# Patient Record
Sex: Male | Born: 1990 | Race: Black or African American | Hispanic: No | Marital: Single | Smoking: Current every day smoker
Health system: Southern US, Community
[De-identification: ages and names within clinical notes are randomized; demographics above are authoritative.]

## PROBLEM LIST (undated history)

## (undated) DIAGNOSIS — D573 Sickle-cell trait: Secondary | ICD-10-CM

## (undated) DIAGNOSIS — I10 Essential (primary) hypertension: Secondary | ICD-10-CM

## (undated) DIAGNOSIS — F419 Anxiety disorder, unspecified: Secondary | ICD-10-CM

## (undated) DIAGNOSIS — R002 Palpitations: Secondary | ICD-10-CM

## (undated) HISTORY — DX: Sickle-cell trait: D57.3

## (undated) HISTORY — DX: Palpitations: R00.2

---
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
## 2008-07-03 ENCOUNTER — Emergency Department (HOSPITAL_COMMUNITY): Admission: EM | Admit: 2008-07-03 | Discharge: 2008-07-03 | Payer: Self-pay | Admitting: Emergency Medicine

## 2008-08-13 ENCOUNTER — Emergency Department (HOSPITAL_COMMUNITY): Admission: EM | Admit: 2008-08-13 | Discharge: 2008-08-13 | Payer: Self-pay | Admitting: Emergency Medicine

## 2009-01-13 ENCOUNTER — Emergency Department (HOSPITAL_COMMUNITY): Admission: EM | Admit: 2009-01-13 | Discharge: 2009-01-14 | Payer: Self-pay | Admitting: Emergency Medicine

## 2009-02-13 ENCOUNTER — Emergency Department (HOSPITAL_COMMUNITY): Admission: EM | Admit: 2009-02-13 | Discharge: 2009-02-13 | Payer: Self-pay | Admitting: Emergency Medicine

## 2009-02-22 ENCOUNTER — Emergency Department (HOSPITAL_COMMUNITY): Admission: EM | Admit: 2009-02-22 | Discharge: 2009-02-22 | Payer: Self-pay | Admitting: Emergency Medicine

## 2009-03-16 IMAGING — CR DG ANKLE COMPLETE 3+V*L*
3 series · 3 of 3 positions shown · non-contrast
Comparison: None

CLINICAL DATA: Injured ankle playing basketball.

LEFT ANKLE COMPLETE - 3+ VIEW

[t ankle joint ap left]
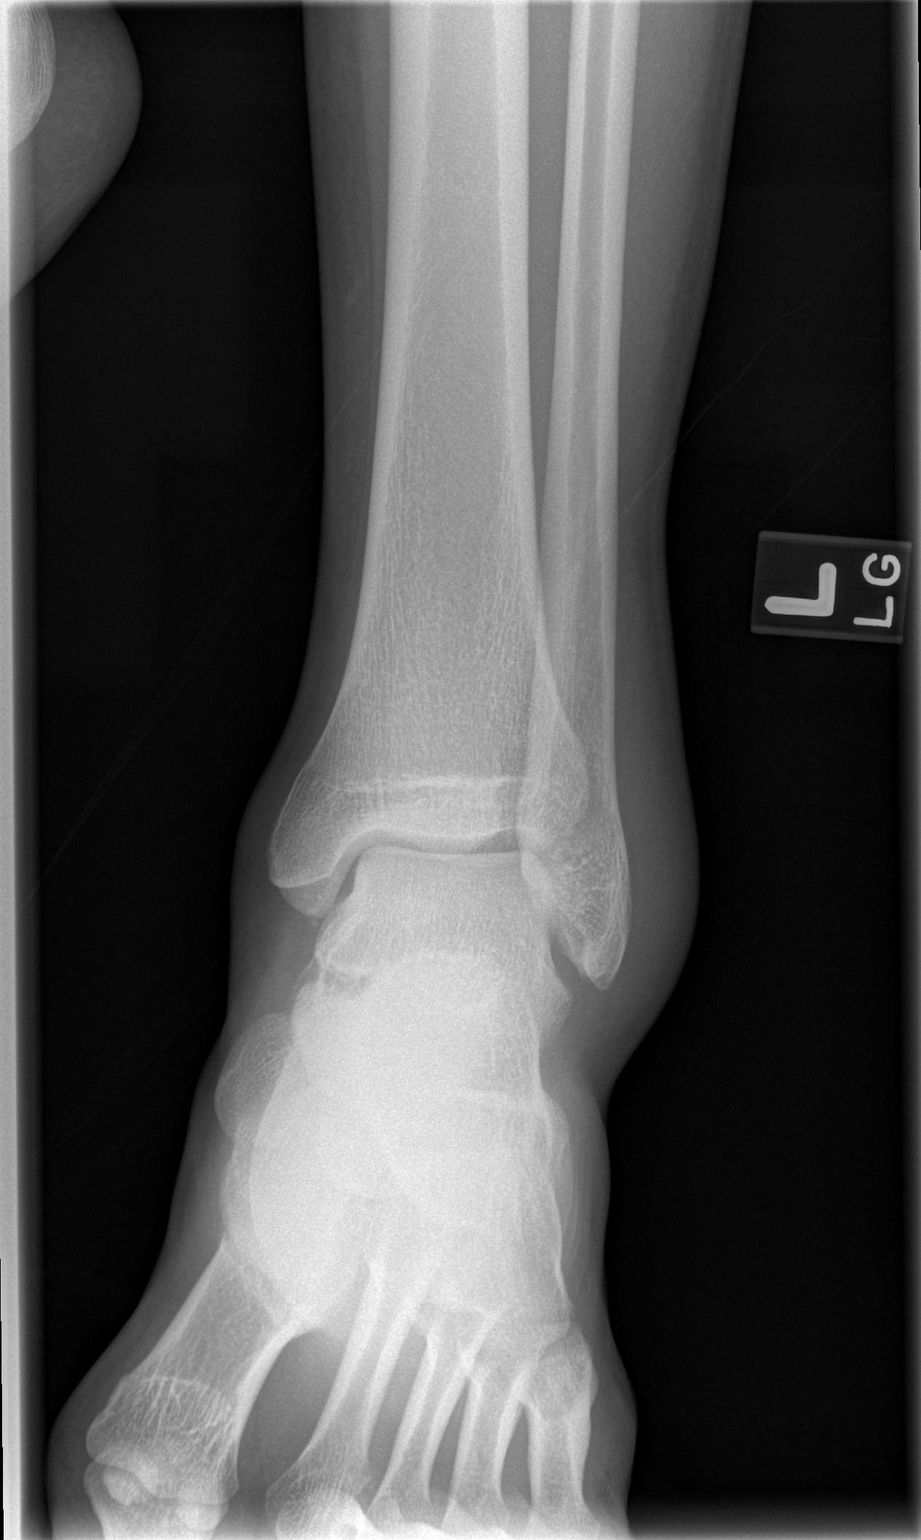

[t ankle joint oblique left]
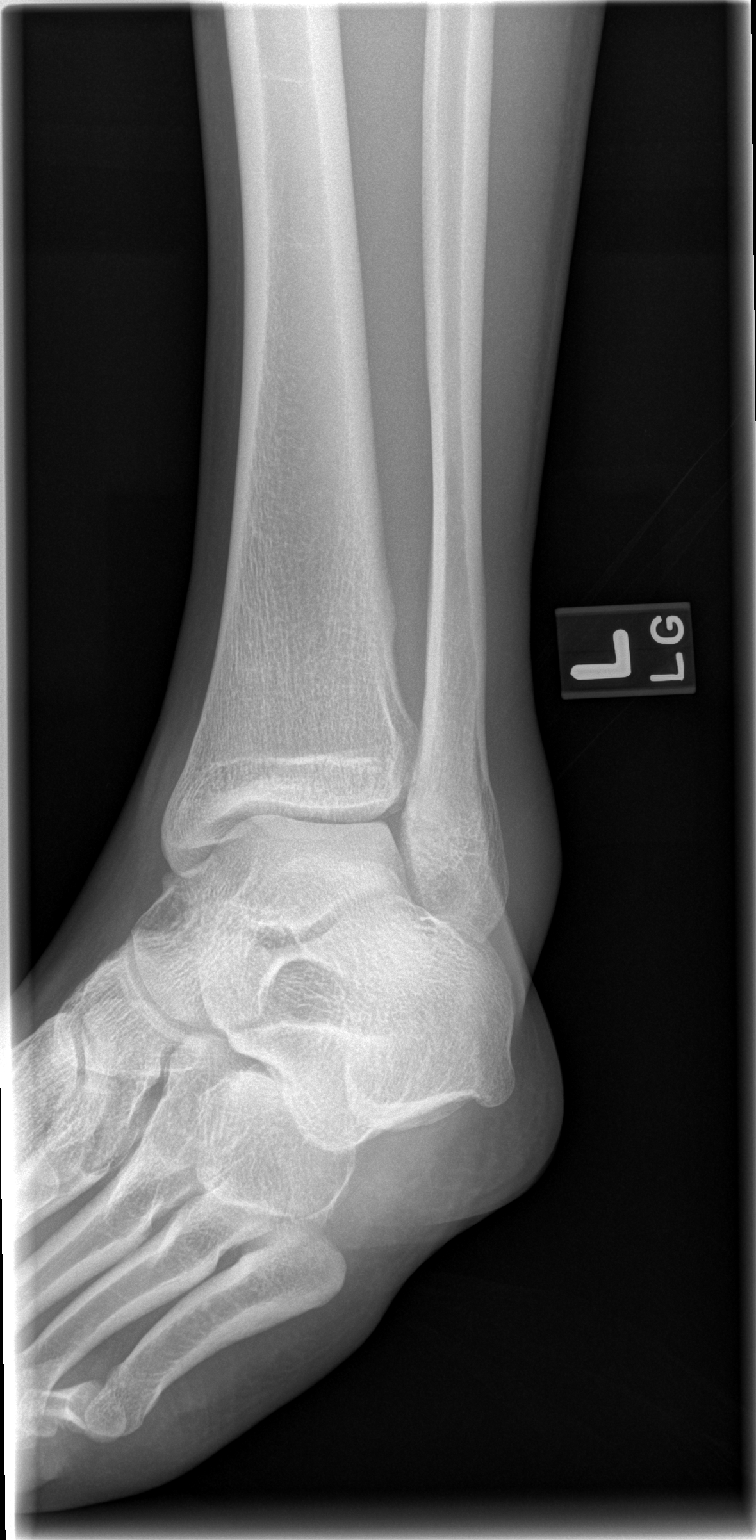

[t ankle joint lat left]
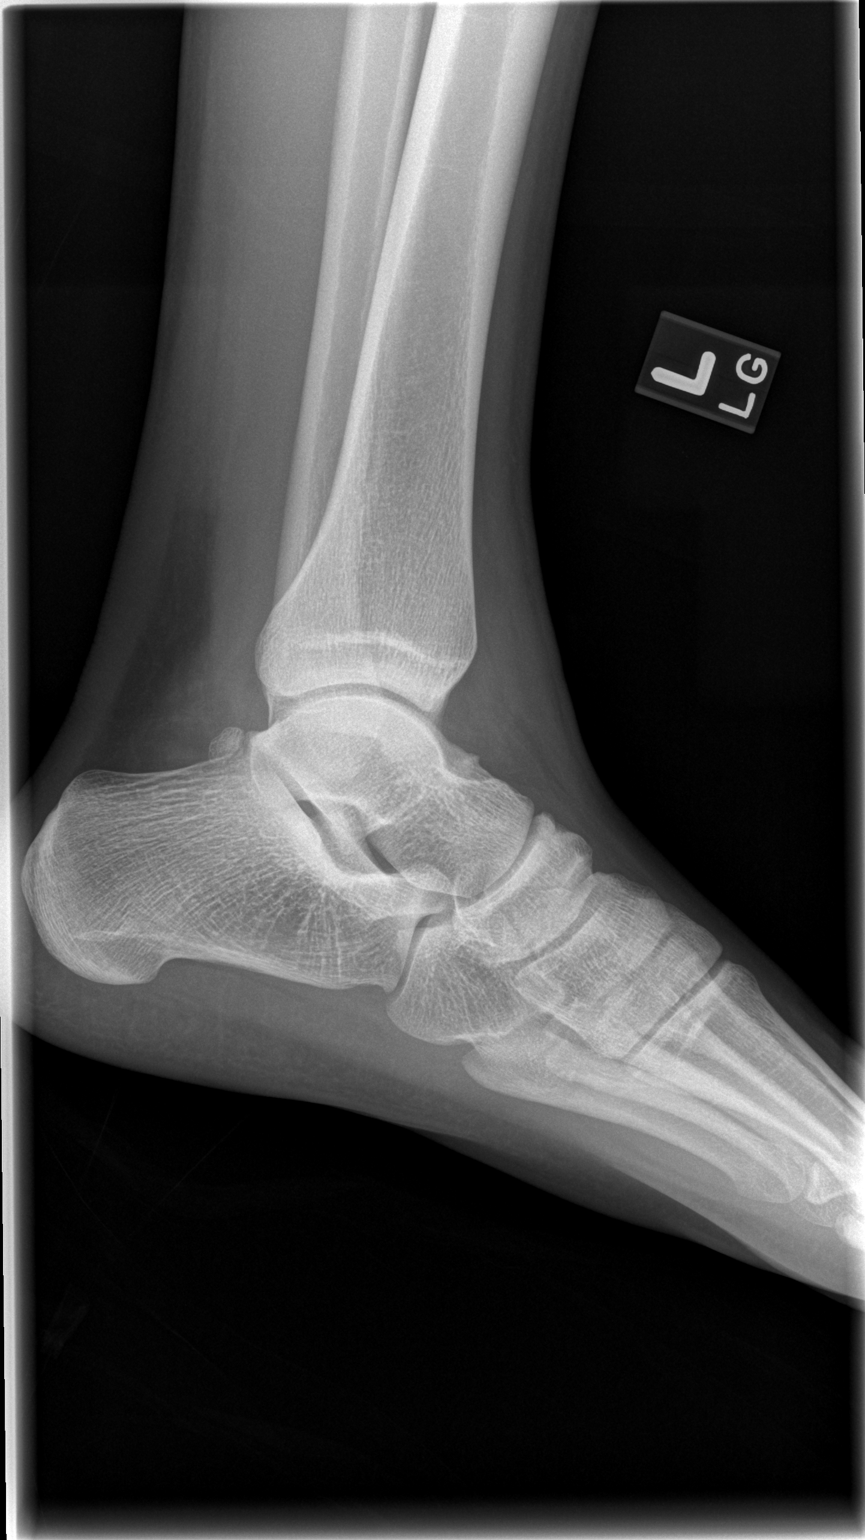

[3 of 3 positions shown; findings below may reference images not displayed]

FINDINGS: The ankle mortise is maintained.  No fractures are seen.
There is significant lateral soft tissue swelling.  Os trigonum is
noted.  The hind and midfoot bony structures are unremarkable.
IMPRESSION: 1.  No acute bony findings.

## 2009-03-27 ENCOUNTER — Emergency Department (HOSPITAL_COMMUNITY): Admission: EM | Admit: 2009-03-27 | Discharge: 2009-03-27 | Payer: Self-pay | Admitting: Emergency Medicine

## 2009-09-28 ENCOUNTER — Emergency Department (HOSPITAL_COMMUNITY): Admission: EM | Admit: 2009-09-28 | Discharge: 2009-09-28 | Payer: Self-pay | Admitting: Emergency Medicine

## 2009-10-28 ENCOUNTER — Emergency Department (HOSPITAL_COMMUNITY): Admission: EM | Admit: 2009-10-28 | Discharge: 2009-10-29 | Payer: Self-pay | Admitting: Emergency Medicine

## 2009-12-20 ENCOUNTER — Emergency Department (HOSPITAL_COMMUNITY): Admission: EM | Admit: 2009-12-20 | Discharge: 2009-12-20 | Payer: Self-pay | Admitting: Emergency Medicine

## 2009-12-24 ENCOUNTER — Emergency Department (HOSPITAL_COMMUNITY): Admission: EM | Admit: 2009-12-24 | Discharge: 2009-12-24 | Payer: Self-pay | Admitting: Emergency Medicine

## 2010-01-13 ENCOUNTER — Emergency Department (HOSPITAL_COMMUNITY): Admission: EM | Admit: 2010-01-13 | Discharge: 2010-01-13 | Payer: Self-pay | Admitting: Emergency Medicine

## 2010-03-07 ENCOUNTER — Emergency Department (HOSPITAL_COMMUNITY)
Admission: EM | Admit: 2010-03-07 | Discharge: 2010-03-08 | Payer: Self-pay | Source: Home / Self Care | Admitting: Emergency Medicine

## 2010-03-10 LAB — URINALYSIS, ROUTINE W REFLEX MICROSCOPIC
Bilirubin Urine: NEGATIVE
Hgb urine dipstick: NEGATIVE
Ketones, ur: NEGATIVE mg/dL
Nitrite: NEGATIVE
Protein, ur: NEGATIVE mg/dL
Specific Gravity, Urine: 1.015 (ref 1.005–1.030)
Urine Glucose, Fasting: NEGATIVE mg/dL
Urobilinogen, UA: 0.2 mg/dL (ref 0.0–1.0)
pH: 6.5 (ref 5.0–8.0)

## 2010-04-28 LAB — URINALYSIS, ROUTINE W REFLEX MICROSCOPIC
Bilirubin Urine: NEGATIVE
Bilirubin Urine: NEGATIVE
Glucose, UA: NEGATIVE mg/dL
Glucose, UA: NEGATIVE mg/dL
Hgb urine dipstick: NEGATIVE
Hgb urine dipstick: NEGATIVE
Ketones, ur: NEGATIVE mg/dL
Ketones, ur: NEGATIVE mg/dL
Nitrite: NEGATIVE
Nitrite: NEGATIVE
Protein, ur: NEGATIVE mg/dL
Protein, ur: NEGATIVE mg/dL
Specific Gravity, Urine: 1.014 (ref 1.005–1.030)
Specific Gravity, Urine: 1.014 (ref 1.005–1.030)
Urobilinogen, UA: 0.2 mg/dL (ref 0.0–1.0)
Urobilinogen, UA: 1 mg/dL (ref 0.0–1.0)
pH: 7.5 (ref 5.0–8.0)
pH: 7 (ref 5.0–8.0)

## 2010-04-28 LAB — GLUCOSE, CAPILLARY
Glucose-Capillary: 115 mg/dL — ABNORMAL HIGH (ref 70–99)
Glucose-Capillary: 114 mg/dL — ABNORMAL HIGH (ref 70–99)

## 2010-04-28 LAB — GC/CHLAMYDIA PROBE AMP, GENITAL
Chlamydia, DNA Probe: NEGATIVE
GC Probe Amp, Genital: NEGATIVE

## 2010-04-30 LAB — COMPREHENSIVE METABOLIC PANEL
ALT: 30 U/L (ref 0–53)
AST: 21 U/L (ref 0–37)
Albumin: 4.1 g/dL (ref 3.5–5.2)
Alkaline Phosphatase: 107 U/L (ref 39–117)
BUN: 11 mg/dL (ref 6–23)
CO2: 29 mEq/L (ref 19–32)
Calcium: 9.1 mg/dL (ref 8.4–10.5)
Chloride: 107 mEq/L (ref 96–112)
Creatinine, Ser: 0.89 mg/dL (ref 0.4–1.5)
GFR calc Af Amer: >60 mL/min (ref >60)
GFR calc non Af Amer: >60 mL/min (ref >60)
Glucose, Bld: 85 mg/dL (ref 70–99)
Potassium: 3.8 mEq/L (ref 3.5–5.1)
Sodium: 141 mEq/L (ref 135–145)
Total Bilirubin: 0.5 mg/dL (ref 0.3–1.2)
Total Protein: 7 g/dL (ref 6.0–8.3)

## 2010-04-30 LAB — CK: Total CK: 192 U/L (ref 7–232)

## 2010-05-03 LAB — GC/CHLAMYDIA PROBE AMP, URINE
Chlamydia, Swab/Urine, PCR: NEGATIVE
GC Probe Amp, Urine: NEGATIVE

## 2010-05-03 LAB — URINALYSIS, ROUTINE W REFLEX MICROSCOPIC
Bilirubin Urine: NEGATIVE
Glucose, UA: NEGATIVE mg/dL
Hgb urine dipstick: NEGATIVE
Ketones, ur: NEGATIVE mg/dL
Nitrite: NEGATIVE
Protein, ur: NEGATIVE mg/dL
Specific Gravity, Urine: 1.016 (ref 1.005–1.030)
Urobilinogen, UA: 0.2 mg/dL (ref 0.0–1.0)
pH: 6 (ref 5.0–8.0)

## 2010-05-03 LAB — URINE CULTURE
Colony Count: NO GROWTH
Culture: NO GROWTH

## 2010-05-03 LAB — GC/CHLAMYDIA PROBE AMP, GENITAL
Chlamydia, DNA Probe: NEGATIVE
GC Probe Amp, Genital: NEGATIVE

## 2010-05-03 LAB — RPR: RPR Ser Ql: NONREACTIVE

## 2010-05-18 LAB — DIFFERENTIAL
Basophils Absolute: 0.1 10*3/uL (ref 0.0–0.1)
Basophils Relative: 1 % (ref 0–1)
Eosinophils Absolute: 0.1 10*3/uL (ref 0.0–0.7)
Eosinophils Relative: 3 % (ref 0–5)
Lymphocytes Relative: 34 % (ref 12–46)
Lymphs Abs: 1.5 10*3/uL (ref 0.7–4.0)
Monocytes Absolute: 0.4 10*3/uL (ref 0.1–1.0)
Monocytes Relative: 10 % (ref 3–12)
Neutro Abs: 2.4 10*3/uL (ref 1.7–7.7)
Neutrophils Relative %: 53 % (ref 43–77)

## 2010-05-18 LAB — CBC
HCT: 44.4 % (ref 39.0–52.0)
Hemoglobin: 15.6 g/dL (ref 13.0–17.0)
MCHC: 35 g/dL (ref 30.0–36.0)
MCV: 92.9 fL (ref 78.0–100.0)
Platelets: 243 10*3/uL (ref 150–400)
RBC: 4.78 MIL/uL (ref 4.22–5.81)
RDW: 13.4 % (ref 11.5–15.5)
WBC: 4.5 10*3/uL (ref 4.0–10.5)

## 2010-05-18 LAB — URINALYSIS, ROUTINE W REFLEX MICROSCOPIC
Bilirubin Urine: NEGATIVE
Glucose, UA: NEGATIVE mg/dL
Hgb urine dipstick: NEGATIVE
Ketones, ur: NEGATIVE mg/dL
Nitrite: NEGATIVE
Protein, ur: NEGATIVE mg/dL
Specific Gravity, Urine: 1.014 (ref 1.005–1.030)
Urobilinogen, UA: 0.2 mg/dL (ref 0.0–1.0)
pH: 6.5 (ref 5.0–8.0)

## 2010-05-18 LAB — BASIC METABOLIC PANEL
BUN: 7 mg/dL (ref 6–23)
CO2: 29 mEq/L (ref 19–32)
Calcium: 9.2 mg/dL (ref 8.4–10.5)
Chloride: 106 mEq/L (ref 96–112)
Creatinine, Ser: 0.82 mg/dL (ref 0.4–1.5)
GFR calc Af Amer: >60 mL/min (ref >60)
GFR calc non Af Amer: >60 mL/min (ref >60)
Glucose, Bld: 103 mg/dL — ABNORMAL HIGH (ref 70–99)
Potassium: 4.1 mEq/L (ref 3.5–5.1)
Sodium: 141 mEq/L (ref 135–145)

## 2010-05-20 LAB — URINALYSIS, ROUTINE W REFLEX MICROSCOPIC
Bilirubin Urine: NEGATIVE
Glucose, UA: NEGATIVE mg/dL
Hgb urine dipstick: NEGATIVE
Ketones, ur: NEGATIVE mg/dL
Nitrite: NEGATIVE
Protein, ur: NEGATIVE mg/dL
Specific Gravity, Urine: 1.015 (ref 1.005–1.030)
Urobilinogen, UA: 0.2 mg/dL (ref 0.0–1.0)
pH: 8 (ref 5.0–8.0)

## 2010-05-20 LAB — RAPID URINE DRUG SCREEN, HOSP PERFORMED
Amphetamines: NOT DETECTED
Barbiturates: NOT DETECTED
Benzodiazepines: NOT DETECTED
Cocaine: NOT DETECTED
Opiates: NOT DETECTED
Tetrahydrocannabinol: NOT DETECTED

## 2010-05-20 LAB — POCT CARDIAC MARKERS
CKMB, poc: 1.4 ng/mL (ref 1.0–8.0)
Myoglobin, poc: 58.5 ng/mL (ref 12–200)
Troponin i, poc: <0.05 ng/mL (ref 0.00–0.09)

## 2010-05-20 LAB — POCT I-STAT, CHEM 8
BUN: 10 mg/dL (ref 6–23)
Calcium, Ion: 1.16 mmol/L (ref 1.12–1.32)
Chloride: 102 mEq/L (ref 96–112)
Creatinine, Ser: 0.9 mg/dL (ref 0.4–1.5)
Glucose, Bld: 93 mg/dL (ref 70–99)
HCT: 46 % (ref 39.0–52.0)
Hemoglobin: 15.6 g/dL (ref 13.0–17.0)
Potassium: 3.7 mEq/L (ref 3.5–5.1)
Sodium: 142 mEq/L (ref 135–145)
TCO2: 28 mmol/L (ref 0–100)

## 2010-07-10 ENCOUNTER — Emergency Department (HOSPITAL_COMMUNITY)
Admission: EM | Admit: 2010-07-10 | Discharge: 2010-07-10 | Disposition: A | Discharge status: Home / Self Care | Payer: 59 | Attending: Emergency Medicine | Admitting: Emergency Medicine

## 2010-07-10 ENCOUNTER — Emergency Department (HOSPITAL_COMMUNITY): Payer: 59

## 2010-07-10 DIAGNOSIS — R369 Urethral discharge, unspecified: Secondary | ICD-10-CM | POA: Insufficient documentation

## 2010-07-10 DIAGNOSIS — R0789 Other chest pain: Secondary | ICD-10-CM | POA: Insufficient documentation

## 2010-07-10 DIAGNOSIS — F17200 Nicotine dependence, unspecified, uncomplicated: Secondary | ICD-10-CM | POA: Insufficient documentation

## 2010-07-11 LAB — GC/CHLAMYDIA PROBE AMP, GENITAL
Chlamydia, DNA Probe: NEGATIVE
GC Probe Amp, Genital: NEGATIVE

## 2010-07-19 ENCOUNTER — Emergency Department (HOSPITAL_COMMUNITY)
Admission: EM | Admit: 2010-07-19 | Discharge: 2010-07-19 | Disposition: A | Discharge status: Home / Self Care | Payer: 59 | Attending: Emergency Medicine | Admitting: Emergency Medicine

## 2010-07-19 DIAGNOSIS — R300 Dysuria: Secondary | ICD-10-CM | POA: Insufficient documentation

## 2010-07-19 DIAGNOSIS — R350 Frequency of micturition: Secondary | ICD-10-CM | POA: Insufficient documentation

## 2010-07-19 DIAGNOSIS — R369 Urethral discharge, unspecified: Secondary | ICD-10-CM | POA: Insufficient documentation

## 2010-07-19 DIAGNOSIS — R109 Unspecified abdominal pain: Secondary | ICD-10-CM | POA: Insufficient documentation

## 2010-07-19 DIAGNOSIS — R030 Elevated blood-pressure reading, without diagnosis of hypertension: Secondary | ICD-10-CM | POA: Insufficient documentation

## 2010-07-19 LAB — URINALYSIS, ROUTINE W REFLEX MICROSCOPIC
Bilirubin Urine: NEGATIVE
Glucose, UA: NEGATIVE mg/dL
Hgb urine dipstick: NEGATIVE
Ketones, ur: NEGATIVE mg/dL
Nitrite: NEGATIVE
Protein, ur: NEGATIVE mg/dL
Specific Gravity, Urine: 1.016 (ref 1.005–1.030)
Urobilinogen, UA: 1 mg/dL (ref 0.0–1.0)
pH: 7 (ref 5.0–8.0)

## 2010-07-20 LAB — GC/CHLAMYDIA PROBE AMP, GENITAL
Chlamydia, DNA Probe: NEGATIVE
GC Probe Amp, Genital: NEGATIVE

## 2010-11-09 LAB — URINALYSIS, ROUTINE W REFLEX MICROSCOPIC
Bilirubin Urine: NEGATIVE
Glucose, UA: NEGATIVE
Hgb urine dipstick: NEGATIVE
Ketones, ur: NEGATIVE
Nitrite: NEGATIVE
Protein, ur: NEGATIVE
Specific Gravity, Urine: 1.011
Urobilinogen, UA: 0.2
pH: 7

## 2010-11-09 LAB — GC/CHLAMYDIA PROBE AMP, GENITAL
Chlamydia, DNA Probe: NEGATIVE
GC Probe Amp, Genital: NEGATIVE

## 2010-11-16 LAB — RPR: RPR Ser Ql: NONREACTIVE

## 2010-11-16 LAB — GC/CHLAMYDIA PROBE AMP, GENITAL
Chlamydia, DNA Probe: NEGATIVE
GC Probe Amp, Genital: NEGATIVE

## 2010-11-20 LAB — POCT URINALYSIS DIP (DEVICE)
Bilirubin Urine: NEGATIVE
Glucose, UA: NEGATIVE
Ketones, ur: NEGATIVE
Nitrite: NEGATIVE
Operator id: 239701
Protein, ur: 30 — AB
Specific Gravity, Urine: 1.015
Urobilinogen, UA: 0.2
pH: 6

## 2010-11-20 LAB — BASIC METABOLIC PANEL
BUN: 7
CO2: 25
Calcium: 9
Chloride: 102
Creatinine, Ser: 0.92
GFR calc Af Amer: >60
GFR calc non Af Amer: >60
Glucose, Bld: 88
Potassium: 3.5
Sodium: 135

## 2010-11-20 LAB — DIFFERENTIAL
Basophils Absolute: 0
Basophils Relative: 0
Eosinophils Absolute: 0 — ABNORMAL LOW
Eosinophils Relative: 0
Lymphocytes Relative: 13
Lymphs Abs: 0.9
Monocytes Absolute: 0.9
Monocytes Relative: 13 — ABNORMAL HIGH
Neutro Abs: 5.2
Neutrophils Relative %: 74

## 2010-11-20 LAB — CBC
HCT: 42.6
Hemoglobin: 14.8
MCHC: 34.7
MCV: 90.2
Platelets: 238
RBC: 4.73
RDW: 12.9
WBC: 7

## 2010-11-20 LAB — GC/CHLAMYDIA PROBE AMP, URINE
Chlamydia, Swab/Urine, PCR: NEGATIVE
GC Probe Amp, Urine: NEGATIVE

## 2010-11-24 LAB — GC/CHLAMYDIA PROBE AMP, GENITAL
Chlamydia, DNA Probe: NEGATIVE
GC Probe Amp, Genital: NEGATIVE

## 2010-11-24 LAB — RPR: RPR Ser Ql: NONREACTIVE

## 2010-11-24 LAB — URINALYSIS, ROUTINE W REFLEX MICROSCOPIC
Bilirubin Urine: NEGATIVE
Glucose, UA: NEGATIVE
Hgb urine dipstick: NEGATIVE
Ketones, ur: NEGATIVE
Nitrite: NEGATIVE
Protein, ur: NEGATIVE
Specific Gravity, Urine: 1.015
Urobilinogen, UA: 1
pH: 7

## 2011-04-05 ENCOUNTER — Emergency Department (HOSPITAL_COMMUNITY)
Admission: EM | Admit: 2011-04-05 | Discharge: 2011-04-06 | Disposition: A | Discharge status: Home / Self Care | Payer: Self-pay | Attending: Emergency Medicine | Admitting: Emergency Medicine

## 2011-04-05 ENCOUNTER — Encounter (HOSPITAL_COMMUNITY): Payer: Self-pay | Admitting: Emergency Medicine

## 2011-04-05 DIAGNOSIS — R300 Dysuria: Secondary | ICD-10-CM | POA: Insufficient documentation

## 2011-04-05 DIAGNOSIS — R369 Urethral discharge, unspecified: Secondary | ICD-10-CM | POA: Insufficient documentation

## 2011-04-05 LAB — URINALYSIS, ROUTINE W REFLEX MICROSCOPIC
Bilirubin Urine: NEGATIVE
Glucose, UA: NEGATIVE mg/dL
Hgb urine dipstick: NEGATIVE
Ketones, ur: NEGATIVE mg/dL
Leukocytes, UA: NEGATIVE
Nitrite: NEGATIVE
Protein, ur: NEGATIVE mg/dL
Specific Gravity, Urine: 1.016 (ref 1.005–1.030)
Urobilinogen, UA: 0.2 mg/dL (ref 0.0–1.0)
pH: 6.5 (ref 5.0–8.0)

## 2011-04-05 NOTE — ED Notes (Signed)
Pt alert, nad, c/o leakage from penis, onset two weeks ago, denies changes in bowel or bladder, denies bleeding or discharge, resp even unlabored, skin pwd

## 2011-04-06 LAB — GC/CHLAMYDIA PROBE AMP, URINE
Chlamydia, Swab/Urine, PCR: NEGATIVE
GC Probe Amp, Urine: NEGATIVE

## 2011-04-06 MED ORDER — CEFTRIAXONE SODIUM 250 MG IJ SOLR
250.0000 mg | Freq: Once | INTRAMUSCULAR | Status: AC
  Administered 2011-04-06: 250 mg via INTRAMUSCULAR
  Filled 2011-04-06: qty 250

## 2011-04-06 MED ORDER — LIDOCAINE HCL 1 % IJ SOLN
INTRAMUSCULAR | Status: AC
  Administered 2011-04-06: 20 mL
  Filled 2011-04-06: qty 20

## 2011-04-06 MED ORDER — AZITHROMYCIN 250 MG PO TABS
1000.0000 mg | ORAL_TABLET | Freq: Once | ORAL | Status: AC
Start: 2011-04-06 — End: 2011-04-06
  Administered 2011-04-06: 1000 mg via ORAL
  Filled 2011-04-06: qty 4

## 2011-04-06 NOTE — ED Provider Notes (Signed)
History     CSN: [id number]  Arrival date & time 04/05/11  2140   First MD Initiated Contact with Patient 04/05/11 2323      Chief Complaint  Patient presents with  . SEXUALLY TRANSMITTED DISEASE    (Consider location/radiation/quality/duration/timing/severity/associated sxs/prior treatment) HPI Comments: Patient presents with dysuria and urethral discharge for the past one month. Patient is sexually active with one partner. He does not think that he has a sexual transmitted disease because his partner was evaluated several weeks ago and did not have any STDs. Patient notes an intermittent, thick, sticky discharge. He does not have this at the current time. He has occasional pain described as a "pinch" during urination. He denies skin changes, ulcerations, or sores.   Patient is a 21 y.o. male presenting with dysuria. The history is provided by the patient.  Dysuria  This is a recurrent problem. The current episode started more than 1 week ago. The problem occurs intermittently. The problem has not changed since onset.There has been no fever. Pertinent negatives include no chills, no sweats, no nausea, no vomiting, no frequency, no hematuria, no hesitancy, no urgency and no flank pain.    History reviewed. No pertinent past medical history.  History reviewed. No pertinent past surgical history.  No family history on file.  History  Substance Use Topics  . Smoking status: Current Everyday Smoker -- 1.0 packs/day    Types: Cigarettes  . Smokeless tobacco: Not on file  . Alcohol Use: No      Review of Systems  Constitutional: Negative for fever and chills.  HENT: Negative for sore throat.   Eyes: Negative for discharge.  Gastrointestinal: Negative for nausea, vomiting and rectal pain.  Genitourinary: Positive for dysuria and discharge. Negative for hesitancy, urgency, frequency, hematuria, flank pain, genital sores, penile pain and testicular pain.  Musculoskeletal: Negative  for arthralgias.  Skin: Negative for rash.  Hematological: Negative for adenopathy.    Allergies  Review of patient's allergies indicates no known allergies.  Home Medications   Current Outpatient Rx  Name Route Sig Dispense Refill  . IBUPROFEN 200 MG PO TABS Oral Take 200 mg by mouth every 6 (six) hours as needed. For pain relief      BP 151/82  Pulse 96  Temp(Src) 98.8 F (37.1 C) (Oral)  Resp 18  Wt 153 lb (69.4 kg)  SpO2 100%  Physical Exam  Nursing note and vitals reviewed. Constitutional: He is oriented to person, place, and time. He appears well-developed and well-nourished.  HENT:  Head: Normocephalic and atraumatic.  Mouth/Throat: No oropharyngeal exudate.  Eyes: Conjunctivae are normal. Right eye exhibits no discharge. Left eye exhibits no discharge.  Neck: Normal range of motion. Neck supple.  Pulmonary/Chest: No respiratory distress.  Genitourinary: Testes normal and penis normal. Right testis shows no mass and no tenderness. Left testis shows no mass and no tenderness. No penile tenderness. No discharge found.  Lymphadenopathy:       Right: No inguinal adenopathy present.       Left: No inguinal adenopathy present.  Neurological: He is alert and oriented to person, place, and time.  Skin: Skin is warm and dry.  Psychiatric: He has a normal mood and affect.    ED Course  Procedures (including critical care time)   Labs Reviewed  URINALYSIS, ROUTINE W REFLEX MICROSCOPIC  GC/CHLAMYDIA PROBE AMP, URINE   No results found.   1. Dysuria    1:18 AM Patient seen and examined. Medications ordered.  Vital signs reviewed and are as follows: Filed Vitals:   04/05/11 2147  BP: 151/82  Pulse: 96  Temp: 98.8 F (37.1 C)  Resp: 18   Hypertension noted.  Will test and treat for STD exposure.  Patient counseled on safe sexual practices.  Told them that they should not have sexual contact for next 7 days and that they need to inform sexual partners so  that they can get tested and treated as well.  Urged f/[facility] with [facility] for HIV and syphillis testing.  Patient verbalizes understanding and agrees with plan.    Patient also given urology referral and told to followup if his symptoms do not improve with treatment for STD.  Patient informed that he'll be notified with positive results. MDM  Patient with urethral discharge. Uncertain if STD exposure. Will treat for GC and Chlamydia. Patient given appropriate followups if not improved.       Carolee Rota, [place] 04/06/11 0119  Medical screening examination/treatment/procedure(s) were performed by non-physician practitioner and as supervising physician I was immediately available for consultation/collaboration.  Sunnie Nielsen, MD 04/06/11 [phone]

## 2011-04-06 NOTE — Discharge Instructions (Signed)
Please read and follow directions below.  You were treated tonight for chlamydia (1 gram azithromycin pills) and gonorrhea (250mg  rocephin shot).  You will be notified if you have a positive test result for either of these infections.  As we discussed, you are being treated as a precaution and we do not know if you have any of these infections.   If you are notified of a positive test, you should follow-up with the [facility]D clinic to be tested for HIV and syphilis.  You should tell your partners about your infection and avoid having sex for one week to allow time for the medicine to work.  If you do not have improvement of your symptoms with this treatment, follow-up with the urologist provided for further testing.   Please return with worsening symptoms or any other concerns.

## 2011-04-13 ENCOUNTER — Other Ambulatory Visit: Payer: Self-pay

## 2011-04-13 ENCOUNTER — Encounter (HOSPITAL_COMMUNITY): Payer: Self-pay | Admitting: *Deleted

## 2011-04-13 ENCOUNTER — Emergency Department (HOSPITAL_COMMUNITY)
Admission: EM | Admit: 2011-04-13 | Discharge: 2011-04-13 | Disposition: A | Discharge status: Home / Self Care | Payer: Self-pay | Attending: Emergency Medicine | Admitting: Emergency Medicine

## 2011-04-13 DIAGNOSIS — E0590 Thyrotoxicosis, unspecified without thyrotoxic crisis or storm: Secondary | ICD-10-CM | POA: Insufficient documentation

## 2011-04-13 DIAGNOSIS — F17200 Nicotine dependence, unspecified, uncomplicated: Secondary | ICD-10-CM | POA: Insufficient documentation

## 2011-04-13 DIAGNOSIS — R4182 Altered mental status, unspecified: Secondary | ICD-10-CM | POA: Insufficient documentation

## 2011-04-13 LAB — COMPREHENSIVE METABOLIC PANEL
ALT: 25 U/L (ref 0–53)
AST: 19 U/L (ref 0–37)
Albumin: 4.2 g/dL (ref 3.5–5.2)
Alkaline Phosphatase: 107 U/L (ref 39–117)
BUN: 10 mg/dL (ref 6–23)
CO2: 25 mEq/L (ref 19–32)
Calcium: 9.9 mg/dL (ref 8.4–10.5)
Chloride: 105 mEq/L (ref 96–112)
Creatinine, Ser: 0.75 mg/dL (ref 0.50–1.35)
GFR calc Af Amer: >90 mL/min (ref >90)
GFR calc non Af Amer: >90 mL/min (ref >90)
Glucose, Bld: 81 mg/dL (ref 70–99)
Potassium: 4.1 mEq/L (ref 3.5–5.1)
Sodium: 141 mEq/L (ref 135–145)
Total Bilirubin: 0.3 mg/dL (ref 0.3–1.2)
Total Protein: 7.2 g/dL (ref 6.0–8.3)

## 2011-04-13 LAB — CBC
HCT: 43.2 % (ref 39.0–52.0)
Hemoglobin: 15.8 g/dL (ref 13.0–17.0)
MCH: 32.4 pg (ref 26.0–34.0)
MCHC: 36.6 g/dL — ABNORMAL HIGH (ref 30.0–36.0)
MCV: 88.5 fL (ref 78.0–100.0)
Platelets: 238 10*3/uL (ref 150–400)
RBC: 4.88 MIL/uL (ref 4.22–5.81)
RDW: 12.8 % (ref 11.5–15.5)
WBC: 4.9 10*3/uL (ref 4.0–10.5)

## 2011-04-13 LAB — RAPID URINE DRUG SCREEN, HOSP PERFORMED
Amphetamines: NOT DETECTED
Barbiturates: NOT DETECTED
Benzodiazepines: NOT DETECTED
Cocaine: NOT DETECTED
Opiates: NOT DETECTED
Tetrahydrocannabinol: NOT DETECTED

## 2011-04-13 LAB — POCT I-STAT TROPONIN I: Troponin i, poc: 0 ng/mL (ref 0.00–0.08)

## 2011-04-13 LAB — TSH: TSH: 0.544 u[IU]/mL (ref 0.350–4.500)

## 2011-04-13 LAB — T4, FREE: Free T4: 1.18 ng/dL (ref 0.80–1.80)

## 2011-04-13 MED ORDER — METOPROLOL TARTRATE 25 MG PO TABS: 25.0000 mg | ORAL_TABLET | Freq: Two times a day (BID) | ORAL | Status: DC

## 2011-04-13 NOTE — ED Provider Notes (Signed)
History     CSN: [id number]  Arrival date & time 04/13/11  5284   First MD Initiated Contact with Patient 04/13/11 1013      Chief Complaint  Patient presents with  . Altered Mental Status    (Consider location/radiation/quality/duration/timing/severity/associated sxs/prior treatment) HPI  Patient is a 21 year old male who presents with 1 week of increased irritability, inability to concentrate, inability to sit still as well as a feeling that his heart is racing and presyncopal syndrome including fading out in hearing a warmth in his face. He reports that he 'fades out' while having a conversation or while in class and it is like he is not there. He reports that he has been yelling at people like a store clerk or his girlfriend for no particular reason. Because these symptoms occur while he is sitting still, he has been constantly moving from place to place. He also reports weight loss of about 15 lbs in the last year though he has been trying to gain weight. He also notes proximal weakness in his arms especially when trying to hoist himself while in bed. He has also noticed tingling in his arms or hands that comes and goes. He has one sexual partner and was checked for STI's including HIV this past summer. He reports his girlfriend is also checked frequently at the health department. He has had a prior anxiety attack in 2010.   History reviewed. No pertinent past medical history.  History reviewed. No pertinent past surgical history.  No family history on file.  History  Substance Use Topics  . Smoking status: Current Everyday Smoker -- 1.0 packs/day    Types: Cigarettes  . Smokeless tobacco: Not on file  . Alcohol Use: Yes      Review of Systems  Allergies  Review of patient's allergies indicates no known allergies.  Home Medications   Current Outpatient Rx  Name Route Sig Dispense Refill  . ASPIRIN 325 MG PO TABS Oral Take 325 mg by mouth daily.    . IBUPROFEN 200 MG  PO TABS Oral Take 200 mg by mouth every 6 (six) hours as needed. For pain relief      BP 156/112  Pulse 98  Temp(Src) 98.8 F (37.1 C) (Oral)  Resp 18  SpO2 100%  Physical Exam General: thin young male standing up in room HEENT: PERRL, EOMI, no scleral icterus Cardiac: RRR, no rubs, murmurs or gallops Pulm: clear to auscultation bilaterally, moving normal volumes of air Abd: soft, nontender, nondistended, BS present Ext: warm and well perfused, no pedal edema Neuro: alert and oriented X3, cranial nerves II-XII grossly intact, sensation equal bilaterally, full strength in bilateral upper and lower extremities.  ED Course  Procedures (including critical care time)  Labs Reviewed - No data to display No results found.   No diagnosis found.   Date: 04/13/2011  Rate: 79  Rhythm: normal sinus rhythm  QRS Axis: normal  Intervals: normal  ST/T Wave abnormalities: normal  Conduction Disutrbances:none  Narrative Interpretation:   Old EKG Reviewed: unchanged since 07/10/2010    MDM  Patient has symptoms of hyperthyroidism. Will have him follow up with internal medicine clinic on Friday for lab results.       Margorie John, MD 04/13/11 1224

## 2011-04-13 NOTE — ED Provider Notes (Signed)
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 He states he should graduate in June 2015. He relates a year ago he weighed 170 pounds and now weighs around 153 he states he has been eating more and working last doesn't understand why he is losing weight. He states he's been feeling bad for a while but in the past week he has had episodes where he feels like his heart races and he gets lightheaded and short of breath with left-sided chest pressure and some diaphoresis.  He states it makes him feel like he is going to pass out.He also relates he started having diarrhea a couple days ago. He states he feels lightheaded all the time now. He relates the last time he felt like his heart was racing was this morning and it lasted about 45 minutes. He states last week he had episode lasted several hours.  Patient is tall thin alert and cooperative. He does not appear to have thyromegaly. He does appear to have a baseline fast heart rate.  I saw and evaluated the patient, reviewed the resident's note and I agree with the findings and plan. I agree with residents EKG interpretation   Results for orders placed during the hospital encounter of 04/13/11  CBC      Component Value Range   WBC 4.9  4.0 - 10.5 (K/uL)   RBC 4.88  4.22 - 5.81 (MIL/uL)   Hemoglobin 15.8  13.0 - 17.0 (g/dL)   HCT 16.1  09.6 - 04.5 (%)   MCV 88.5  78.0 - 100.0 (fL)   MCH 32.4  26.0 - 34.0 (pg)   MCHC 36.6 (*) 30.0 - 36.0 (g/dL)   RDW 40.9  81.1 - 91.4 (%)   Platelets 238  150 - 400 (K/uL)  COMPREHENSIVE METABOLIC PANEL      Component Value Range   Sodium 141  135 - 145 (mEq/L)   Potassium 4.1  3.5 - 5.1 (mEq/L)   Chloride 105  96 - 112 (mEq/L)   CO2 25  19 - 32 (mEq/L)   Glucose, Bld 81  70 - 99 (mg/dL)   BUN 10  6 - 23 (mg/dL)   Creatinine, Ser 7.82  0.50 - 1.35 (mg/dL)   Calcium 9.9  8.4 - 95.6 (mg/dL)   Total Protein 7.2  6.0 - 8.3 (g/dL)   Albumin 4.2  3.5 - 5.2 (g/dL)   AST 19  0 - 37 (U/L)   ALT 25  0 - 53 (U/L)   Alkaline Phosphatase 107  39 - 117 (U/L)   Total Bilirubin 0.3  0.3 - 1.2 (mg/dL)   GFR calc non Af Amer >90  >90 (mL/min)   GFR calc Af Amer >90  >90 (mL/min)  POCT I-STAT TROPONIN I      Component Value Range   Troponin i, poc 0.00  0.00 - 0.08 (ng/mL)   Comment 3            Laboratory interpretation all normal     Devoria Albe, MD, [facility]ando [facility]   Ward Givens, MD 04/13/11 [phone]

## 2011-04-13 NOTE — ED Notes (Signed)
Patient reports he has noted that he feels like he is drifting off,  States he is in class but he is not listening to the instructor.  He also reports he feels like his jaw is shaking at time.  Patient denies headaches, denies n/v/d.  Patient states he has felt tired lately.  He states he just does not feel good

## 2011-04-13 NOTE — Discharge Instructions (Signed)
Please follow up with internal medicine clinic on Friday at 10:45am for lab results.

## 2011-04-13 NOTE — ED Notes (Addendum)
Pt reports he has been feeling out of it x2 wks, pt reports unable to listen while at school, pt reports s/s start just before arriving to school, becoming confused while at school, becomes irritated during the day and just doesn't care what is being said, reports he is in school from 7am-6:30pm everyday, reports he feels body feels weak approx 7pm every day arms tremble when he attempts to left his self off the bed, pt reports going to bed every night 10pm, states "if I get a buzz I'm out about 8pm." Pt denies any change in eating habits, eats fast food for breakfast and lunch and take out pizza on his way home, states "I'm too young to be this tired." No neuro deficits noted. Pt also reports feeling like he is going to pass out.

## 2011-04-13 NOTE — ED Provider Notes (Signed)
See prior note   Ward Givens, MD 04/13/11 [phone]

## 2011-04-16 ENCOUNTER — Ambulatory Visit (INDEPENDENT_AMBULATORY_CARE_PROVIDER_SITE_OTHER): Payer: Self-pay | Admitting: Ophthalmology

## 2011-04-16 DIAGNOSIS — F419 Anxiety disorder, unspecified: Secondary | ICD-10-CM

## 2011-04-16 DIAGNOSIS — R197 Diarrhea, unspecified: Secondary | ICD-10-CM

## 2011-04-16 DIAGNOSIS — F410 Panic disorder [episodic paroxysmal anxiety] without agoraphobia: Secondary | ICD-10-CM | POA: Insufficient documentation

## 2011-04-16 DIAGNOSIS — R634 Abnormal weight loss: Secondary | ICD-10-CM | POA: Insufficient documentation

## 2011-04-16 DIAGNOSIS — F411 Generalized anxiety disorder: Secondary | ICD-10-CM | POA: Insufficient documentation

## 2011-04-16 MED ORDER — CITALOPRAM HYDROBROMIDE 20 MG PO TABS: 20.0000 mg | ORAL_TABLET | Freq: Every day | ORAL | Status: DC

## 2011-04-16 MED ORDER — LORAZEPAM 0.5 MG PO TABS: 0.5000 mg | ORAL_TABLET | Freq: Three times a day (TID) | ORAL | Status: AC | PRN

## 2011-04-16 NOTE — Assessment & Plan Note (Signed)
Patient has physiological symptoms of anxiety with sitting still in a classroom setting. Will prescribe celexa once daily and ativan BID PRN until celexa begins to work. Will not prescribe ativan long term. May need to titrate up celexa. Also advised him to avoid nicotine and to try to walk outside between his classes.

## 2011-04-16 NOTE — Assessment & Plan Note (Signed)
Patient is not hyperthyroid and has been [facility]. May be related to anxiety. Can recheck HIV if suspicion increases.

## 2011-04-16 NOTE — Patient Instructions (Signed)
-  Take celexa once daily. This will help with anxiety but it will take several weeks to start working. -Take ativan when you are feeling very anxious once or twice a day. Try to limit the amount of ativan you use.

## 2011-04-16 NOTE — Progress Notes (Signed)
  Subjective:   Patient ID: Danny Bishop male   DOB: [birth year] 21 y.o.   MRN: [id number]  HPI: Mr.Danny Bishop is a 21 y.o. for follow up from ER for concern for hyperthyroidism for lab results.  He feels anxious when he sits down, not when he is up and walking around. When he sits down he feels like his heart is racing and his breathing is increased. No coffee, no tea, has soda but he chooses caffeine free soda. Smokes 1/2 pack a day. Has had trouble focusing in school always. School has been more stressful since his hours are longer in the past 2 months. 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 He was up and working on his feet for the rest of the time previously. In August, he will be able to make his schedule less busy. He has been using lopressor prescribed in the ED a few days ago and reports that it helps somewhat with the feeling of his heart racing but not fully.   Also has weight loss of about 15 lbs in the past year with trying to gain weight.Has some diarrhea at night and in the morning. It is watery. Pretty much every day for the last couple of weeks. No belly pain, cramping, no gassiness.   Feels like he needs to urinate all the time. Has been seen in ER several times for this issue. Feels like he has to wait at the toilet for a second urge to go. Feels like it is stuck in his penis. Has seen urologist who performed urethral scoping and he didn't know what was wrong.  No past medical history on file. Current Outpatient Prescriptions  Medication Sig Dispense Refill  . aspirin 325 MG tablet Take 325 mg by mouth daily.      Marland Kitchen ibuprofen (ADVIL,MOTRIN) 200 MG tablet Take 200 mg by mouth every 6 (six) hours as needed. For pain relief      . metoprolol tartrate (LOPRESSOR) 25 MG tablet Take 1 tablet (25 mg total) by mouth 2 (two) times daily.  30 tablet  0   No family history on file. History   Social History  .  Marital Status: Single    Spouse Name: N/A    Number of Children: N/A  . Years of Education: N/A   Social History Main Topics  . Smoking status: Current Everyday Smoker -- 1.0 packs/day    Types: Cigarettes  . Smokeless tobacco: Not on file  . Alcohol Use: Yes  . Drug Use: No  . Sexually Active:    Other Topics Concern  . Not on file   Social History Narrative  . No narrative on file   Objective:  Physical Exam: Filed Vitals:   04/16/11 1104  BP: 122/74  Pulse: 72  Temp: 98.6 F (37 C)  TempSrc: Oral  Weight: 155 lb (70.308 kg)   General: thin young man standing up in exam room HEENT: PERRL, EOMI, no scleral icterus Cardiac: RRR, no rubs, murmurs or gallops Pulm: clear to auscultation bilaterally, moving normal volumes of air Abd: soft, nontender, nondistended, BS present Ext: warm and well perfused, no pedal edema Neuro: alert and oriented X3, cranial nerves II-XII grossly intact  Assessment & Plan:

## 2011-04-16 NOTE — Assessment & Plan Note (Signed)
Not hyperthyroid. He does not seem to have symptoms of celiac or lactose intolerance since no cramping, gassiness etc.  Can consider further work up if not resolved.

## 2011-05-21 ENCOUNTER — Encounter: Payer: Self-pay | Admitting: Ophthalmology

## 2011-11-06 ENCOUNTER — Encounter (HOSPITAL_COMMUNITY): Payer: Self-pay | Admitting: *Deleted

## 2011-11-06 ENCOUNTER — Emergency Department (HOSPITAL_COMMUNITY)
Admission: EM | Admit: 2011-11-06 | Discharge: 2011-11-06 | Disposition: A | Discharge status: Home / Self Care | Payer: Self-pay | Attending: Emergency Medicine | Admitting: Emergency Medicine

## 2011-11-06 DIAGNOSIS — N342 Other urethritis: Secondary | ICD-10-CM | POA: Insufficient documentation

## 2011-11-06 DIAGNOSIS — F17200 Nicotine dependence, unspecified, uncomplicated: Secondary | ICD-10-CM | POA: Insufficient documentation

## 2011-11-06 MED ORDER — METRONIDAZOLE 500 MG PO TABS: 500.0000 mg | ORAL_TABLET | Freq: Two times a day (BID) | ORAL | Status: DC

## 2011-11-06 NOTE — ED Notes (Signed)
Pt states he was to go to health department and receive 7 days of antibiotics for STD and he didn't get off work in time to get his medication so he would like to get tonight in ED

## 2011-11-06 NOTE — ED Provider Notes (Signed)
History     CSN: [id number]  Arrival date & time 11/06/11  1805   First MD Initiated Contact with Patient 11/06/11 2101      Chief Complaint  Patient presents with  . SEXUALLY TRANSMITTED DISEASE    (Consider location/radiation/quality/duration/timing/severity/associated sxs/prior treatment) HPI  21 y.o. male INAD c/o stinging sensation to penis worsening over the course of 3 weeks. Sensation is not timed to urination. Patient denies any rash, urethral discharge, abdominal pain, testicular swelling or pain, fever, nausea vomiting.. Patient friend was tested for STDs and found to have bacterial vaginosis. Patient was advised to present to the city department of health for her treatment. He cannot presents to the city department of health a timely fashion because he got his shift at work. He is requesting treatment specifically for metronidazole twice a day for 7 days.  No past medical history on file.  No past surgical history on file.  History reviewed. No pertinent family history.  History  Substance Use Topics  . Smoking status: Current Every Day Smoker -- 1.0 packs/day    Types: Cigarettes  . Smokeless tobacco: Not on file  . Alcohol Use: Yes      Review of Systems  Constitutional: Negative for fever.  Respiratory: Negative for shortness of breath.   Cardiovascular: Negative for chest pain.  Gastrointestinal: Negative for nausea, vomiting, abdominal pain and diarrhea.  All other systems reviewed and are negative.    Allergies  Review of patient's allergies indicates no known allergies.  Home Medications   Current Outpatient Rx  Name Route Sig Dispense Refill  . ACETAMINOPHEN 325 MG PO TABS Oral Take 1,300 mg by mouth every 6 (six) hours as needed. migraine      BP 148/85  Pulse 84  Temp 98.7 F (37.1 C) (Oral)  Resp 16  Ht 6' (1.829 m)  Wt 137 lb (62.143 kg)  BMI 18.58 kg/m2  SpO2 100%  Physical Exam  Nursing note and vitals  reviewed. Constitutional: He is oriented to person, place, and time. He appears well-developed and well-nourished. No distress.  HENT:  Head: Normocephalic.  Eyes: Conjunctivae normal and EOM are normal.  Cardiovascular: Normal rate.   Pulmonary/Chest: Effort normal. No stridor.  Musculoskeletal: Normal range of motion.  Neurological: He is alert and oriented to person, place, and time.  Psychiatric: He has a normal mood and affect.    ED Course  Procedures (including critical care time)  Labs Reviewed - No data to display No results found.   1. Urethritis       MDM  I offered patient focused STD screen he refused. States that he is in a monogamous relationship with his girlfriend and since she has been tested he thinks it is unnecessary.  New Prescriptions   METRONIDAZOLE (FLAGYL) 500 MG TABLET    Take 1 tablet (500 mg total) by mouth 2 (two) times daily.           Wynetta Emery, PA-C 11/06/11 2124

## 2011-11-07 NOTE — ED Provider Notes (Signed)
Medical screening examination/treatment/procedure(s) were performed by non-physician practitioner and as supervising physician I was immediately available for consultation/collaboration.  Austin Herd L Lars Jeziorski, MD 11/07/11 0037 

## 2011-11-20 ENCOUNTER — Emergency Department (HOSPITAL_COMMUNITY)
Admission: EM | Admit: 2011-11-20 | Discharge: 2011-11-20 | Disposition: A | Discharge status: Home / Self Care | Payer: Self-pay | Attending: Emergency Medicine | Admitting: Emergency Medicine

## 2011-11-20 DIAGNOSIS — F17200 Nicotine dependence, unspecified, uncomplicated: Secondary | ICD-10-CM | POA: Insufficient documentation

## 2011-11-20 DIAGNOSIS — Z711 Person with feared health complaint in whom no diagnosis is made: Secondary | ICD-10-CM

## 2011-11-20 DIAGNOSIS — Z202 Contact with and (suspected) exposure to infections with a predominantly sexual mode of transmission: Secondary | ICD-10-CM | POA: Insufficient documentation

## 2011-11-20 MED ORDER — AZITHROMYCIN 250 MG PO TABS
1000.0000 mg | ORAL_TABLET | Freq: Once | ORAL | Status: AC
  Administered 2011-11-20: 1000 mg via ORAL
  Filled 2011-11-20: qty 4

## 2011-11-20 MED ORDER — CEFTRIAXONE SODIUM 250 MG IJ SOLR
250.0000 mg | Freq: Once | INTRAMUSCULAR | Status: AC
  Administered 2011-11-20: 250 mg via INTRAMUSCULAR
  Filled 2011-11-20: qty 250

## 2011-11-20 NOTE — ED Notes (Signed)
Pt here for STD check. Pt states his partner has chlimidya. Pt has been taking doxycycline for bacterial infection. Pt states he is having drainage from his penis and painful urination.

## 2011-11-20 NOTE — ED Notes (Signed)
Received phone call reporting pt had chlamydia. Reports he had to come to ED because health department does not see pts after 2pm

## 2011-11-20 NOTE — ED Provider Notes (Signed)
History     CSN: [id number]  Arrival date & time 11/20/11  1338   First MD Initiated Contact with Patient 11/20/11 1657      No chief complaint on file.   (Consider location/radiation/quality/duration/timing/severity/associated sxs/prior treatment) HPI  Pt here for medication to treat suspected STD. He says that "his old lady" called and told him she is pregnant and that she has chlamydia. He says they have both been sleeping with other people so he doesn't know who gave what to whom. He admits to discharge from his penis and dysuria. NOP N/V/D, dysuria, back or abdominal pains. NAD/ VSS  No past medical history on file.  No past surgical history on file.  No family history on file.  History  Substance Use Topics  . Smoking status: Current Every Day Smoker -- 1.0 packs/day    Types: Cigarettes  . Smokeless tobacco: Not on file  . Alcohol Use: Yes      Review of Systems  Review of Systems  Gen: no weight loss, fevers, chills, night sweats  Eyes: no discharge or drainage, no occular pain or visual changes  Nose: no epistaxis or rhinorrhea  Mouth: no dental pain, no sore throat  Neck: no neck pain  Lungs:No wheezing, coughing or hemoptysis CV: no chest pain, palpitations, dependent edema or orthopnea  Abd: no abdominal pain, nausea, vomiting  GU: + dysuria and penile discharge, no gross hematuria  MSK:  No abnormalities  Neuro: no headache, no focal neurologic deficits  Skin: no abnormalities Psyche: negative.   Allergies  Review of patient's allergies indicates no known allergies.  Home Medications   Current Outpatient Rx  Name Route Sig Dispense Refill  . ACETAMINOPHEN 325 MG PO TABS Oral Take 1,300 mg by mouth every 6 (six) hours as needed. migraine    . METRONIDAZOLE 500 MG PO TABS Oral Take 500 mg by mouth 2 (two) times daily. For 7 days.      BP 136/82  Pulse 91  Temp 98.9 F (37.2 C) (Oral)  Resp 18  SpO2 100%  Physical Exam  Nursing note and  vitals reviewed. Constitutional: He appears well-developed and well-nourished. No distress.  HENT:  Head: Normocephalic and atraumatic.  Eyes: Pupils are equal, round, and reactive to light.  Neck: Normal range of motion. Neck supple.  Cardiovascular: Normal rate and regular rhythm.   Pulmonary/Chest: Effort normal.  Abdominal: Soft.  Genitourinary: Testes normal and penis normal.  Neurological: He is alert.  Skin: Skin is warm and dry.    ED Course  Procedures (including critical care time)   Labs Reviewed  GC/CHLAMYDIA PROBE AMP, GENITAL   No results found.   1. Concern about STD in male without diagnosis       MDM  Swabs taken for GC testing. Azithro/Rocephin given. Safe sex precautions given.  Pt has been advised of the symptoms that warrant their return to the ED. Patient has voiced understanding and has agreed to follow-up with the PCP or specialist.         Dorthula Matas, PA 11/20/11 1712

## 2011-11-22 LAB — GC/CHLAMYDIA PROBE AMP, GENITAL
Chlamydia, DNA Probe: NEGATIVE
GC Probe Amp, Genital: NEGATIVE

## 2011-11-23 NOTE — ED Provider Notes (Signed)
Medical screening examination/treatment/procedure(s) were performed by non-physician practitioner and as supervising physician I was immediately available for consultation/collaboration.  Athalene Kolle, MD 11/23/11 2353 

## 2011-11-30 ENCOUNTER — Emergency Department (HOSPITAL_COMMUNITY)
Admission: EM | Admit: 2011-11-30 | Discharge: 2011-11-30 | Disposition: A | Discharge status: Home / Self Care | Payer: Self-pay | Attending: Emergency Medicine | Admitting: Emergency Medicine

## 2011-11-30 ENCOUNTER — Encounter (HOSPITAL_COMMUNITY): Payer: Self-pay

## 2011-11-30 DIAGNOSIS — L299 Pruritus, unspecified: Secondary | ICD-10-CM | POA: Insufficient documentation

## 2011-11-30 DIAGNOSIS — R21 Rash and other nonspecific skin eruption: Secondary | ICD-10-CM | POA: Insufficient documentation

## 2011-11-30 DIAGNOSIS — F17200 Nicotine dependence, unspecified, uncomplicated: Secondary | ICD-10-CM | POA: Insufficient documentation

## 2011-11-30 MED ORDER — HYDROXYZINE HCL 25 MG PO TABS: 25.0000 mg | ORAL_TABLET | Freq: Four times a day (QID) | ORAL | Status: DC

## 2011-11-30 MED ORDER — PREDNISONE 20 MG PO TABS: 60.0000 mg | ORAL_TABLET | Freq: Every day | ORAL | Status: DC

## 2011-11-30 MED ORDER — PREDNISONE 20 MG PO TABS
60.0000 mg | ORAL_TABLET | Freq: Once | ORAL | Status: AC
  Administered 2011-11-30: 60 mg via ORAL
  Filled 2011-11-30: qty 3

## 2011-11-30 MED ORDER — DIPHENHYDRAMINE HCL 25 MG PO CAPS
25.0000 mg | ORAL_CAPSULE | Freq: Once | ORAL | Status: AC
  Administered 2011-11-30: 25 mg via ORAL
  Filled 2011-11-30: qty 1

## 2011-11-30 NOTE — ED Notes (Signed)
Generalized bug bites x 3 days c/o of itching

## 2011-11-30 NOTE — ED Provider Notes (Signed)
History     CSN: [id number]  Arrival date & time 11/30/11  1707   First MD Initiated Contact with Patient 11/30/11 1711      Chief Complaint  Patient presents with  . Rash    bed bugs    (Consider location/radiation/quality/duration/timing/severity/associated sxs/prior treatment) HPI Comments: Patient reports that he has had a rash for the past 3 days.  The rash does itch.  No known contacts with similar rash.  He thinks that he was bitten by bugs, but did not see any bugs or feel any bugs bite him.  He denies any new medications.  He states that he started using a new soap a few days ago.  He denies any pain.    Patient is a 21 y.o. male presenting with rash. The history is provided by the patient.  Rash  There has been no fever. The rash is present on the left arm, right arm, right upper leg, right lower leg, left upper leg, left lower leg and trunk. The patient is experiencing no pain. Associated symptoms include blisters and itching. Pertinent negatives include no pain. He has tried nothing for the symptoms.    History reviewed. No pertinent past medical history.  History reviewed. No pertinent past surgical history.  No family history on file.  History  Substance Use Topics  . Smoking status: Current Every Day Smoker -- 1.0 packs/day    Types: Cigarettes  . Smokeless tobacco: Not on file  . Alcohol Use: Yes      Review of Systems  Constitutional: Negative for fever and chills.  HENT: Negative for sore throat, neck pain and neck stiffness.   Respiratory: Negative for shortness of breath and wheezing.   Skin: Positive for itching and rash.  Neurological: Negative for headaches.  All other systems reviewed and are negative.    Allergies  Review of patient's allergies indicates no known allergies.  Home Medications   Current Outpatient Rx  Name Route Sig Dispense Refill  . ACETAMINOPHEN 325 MG PO TABS Oral Take 650 mg by mouth every 6 (six) hours as needed.  migraine    . DIPHENHYDRAMINE HCL 25 MG PO CAPS Oral Take 25 mg by mouth every 4 (four) hours as needed. For itching.      BP 140/64  Pulse 99  Temp 98.4 F (36.9 C) (Oral)  Resp 18  Wt 140 lb (63.504 kg)  SpO2 100%  Physical Exam  Nursing note and vitals reviewed. Constitutional: He appears well-developed and well-nourished. No distress.  HENT:  Head: Normocephalic and atraumatic.  Mouth/Throat: Oropharynx is clear and moist.  Neck: Normal range of motion. Neck supple.  Cardiovascular: Normal rate, regular rhythm, normal heart sounds and intact distal pulses.   Pulmonary/Chest: Effort normal and breath sounds normal.  Musculoskeletal: Normal range of motion.  Neurological: He is alert.  Skin: Skin is warm and dry. Rash noted. No petechiae and no purpura noted. Rash is not urticarial. He is not diaphoretic.       Erythematous papules on both arms, both legs, chest, and back.  Papules are pruritic.  Small papule on the left forearm draining small amount of clear liquid.    Psychiatric: He has a normal mood and affect.    ED Course  Procedures (including critical care time)  Labs Reviewed - No data to display No results found.   No diagnosis found.    MDM  Patient presenting with numerous erythematous papules located on both arms, both legs, and the  trunk. Patient given short course of Hydroxyzine and Prednisone.          Pascal Lux [place], PA-C 12/01/11 1310

## 2011-12-01 NOTE — ED Provider Notes (Signed)
Medical screening examination/treatment/procedure(s) were performed by non-physician practitioner and as supervising physician I was immediately available for consultation/collaboration.   Meli Faley, MD 12/01/11 1503 

## 2012-02-10 ENCOUNTER — Emergency Department (HOSPITAL_COMMUNITY): Payer: Self-pay

## 2012-02-10 ENCOUNTER — Emergency Department (HOSPITAL_COMMUNITY)
Admission: EM | Admit: 2012-02-10 | Discharge: 2012-02-10 | Disposition: A | Discharge status: Home / Self Care | Payer: Self-pay | Attending: Emergency Medicine | Admitting: Emergency Medicine

## 2012-02-10 ENCOUNTER — Encounter (HOSPITAL_COMMUNITY): Payer: Self-pay | Admitting: Emergency Medicine

## 2012-02-10 DIAGNOSIS — R109 Unspecified abdominal pain: Secondary | ICD-10-CM | POA: Insufficient documentation

## 2012-02-10 DIAGNOSIS — R05 Cough: Secondary | ICD-10-CM | POA: Insufficient documentation

## 2012-02-10 DIAGNOSIS — F17200 Nicotine dependence, unspecified, uncomplicated: Secondary | ICD-10-CM | POA: Insufficient documentation

## 2012-02-10 DIAGNOSIS — R059 Cough, unspecified: Secondary | ICD-10-CM | POA: Insufficient documentation

## 2012-02-10 DIAGNOSIS — R630 Anorexia: Secondary | ICD-10-CM | POA: Insufficient documentation

## 2012-02-10 DIAGNOSIS — R112 Nausea with vomiting, unspecified: Secondary | ICD-10-CM | POA: Insufficient documentation

## 2012-02-10 DIAGNOSIS — Z8659 Personal history of other mental and behavioral disorders: Secondary | ICD-10-CM | POA: Insufficient documentation

## 2012-02-10 DIAGNOSIS — R509 Fever, unspecified: Secondary | ICD-10-CM | POA: Insufficient documentation

## 2012-02-10 DIAGNOSIS — R350 Frequency of micturition: Secondary | ICD-10-CM | POA: Insufficient documentation

## 2012-02-10 DIAGNOSIS — R5381 Other malaise: Secondary | ICD-10-CM | POA: Insufficient documentation

## 2012-02-10 LAB — URINALYSIS, ROUTINE W REFLEX MICROSCOPIC
Bilirubin Urine: NEGATIVE
Glucose, UA: NEGATIVE mg/dL
Ketones, ur: 15 mg/dL — AB
Leukocytes, UA: NEGATIVE
Nitrite: NEGATIVE
Protein, ur: 100 mg/dL — AB
Specific Gravity, Urine: 1.02 (ref 1.005–1.030)
Urobilinogen, UA: 1 mg/dL (ref 0.0–1.0)
pH: 6 (ref 5.0–8.0)

## 2012-02-10 LAB — CBC
HCT: 48.3 % (ref 39.0–52.0)
Hemoglobin: 17.7 g/dL — ABNORMAL HIGH (ref 13.0–17.0)
MCH: 32.3 pg (ref 26.0–34.0)
MCHC: 36.6 g/dL — ABNORMAL HIGH (ref 30.0–36.0)
MCV: 88.1 fL (ref 78.0–100.0)
Platelets: 210 10*3/uL (ref 150–400)
RBC: 5.48 MIL/uL (ref 4.22–5.81)
RDW: 13.3 % (ref 11.5–15.5)
WBC: 4.3 10*3/uL (ref 4.0–10.5)

## 2012-02-10 LAB — BASIC METABOLIC PANEL
BUN: 14 mg/dL (ref 6–23)
CO2: 27 mEq/L (ref 19–32)
Calcium: 9.7 mg/dL (ref 8.4–10.5)
Chloride: 98 mEq/L (ref 96–112)
Creatinine, Ser: 0.94 mg/dL (ref 0.50–1.35)
GFR calc Af Amer: >90 mL/min (ref >90)
GFR calc non Af Amer: >90 mL/min (ref >90)
Glucose, Bld: 85 mg/dL (ref 70–99)
Potassium: 4 mEq/L (ref 3.5–5.1)
Sodium: 137 mEq/L (ref 135–145)

## 2012-02-10 LAB — URINE MICROSCOPIC-ADD ON

## 2012-02-10 MED ORDER — OXYCODONE-ACETAMINOPHEN 5-325 MG PO TABS: 2.0000 | ORAL_TABLET | ORAL | Status: DC | PRN

## 2012-02-10 MED ORDER — ONDANSETRON HCL 4 MG/2ML IJ SOLN
4.0000 mg | Freq: Once | INTRAMUSCULAR | Status: AC
  Administered 2012-02-10: 4 mg via INTRAVENOUS
  Filled 2012-02-10: qty 2

## 2012-02-10 MED ORDER — SODIUM CHLORIDE 0.9 % IV BOLUS (SEPSIS)
1000.0000 mL | Freq: Once | INTRAVENOUS | Status: AC
  Administered 2012-02-10: 1000 mL via INTRAVENOUS

## 2012-02-10 MED ORDER — ONDANSETRON HCL 4 MG PO TABS: 4.0000 mg | ORAL_TABLET | Freq: Four times a day (QID) | ORAL | Status: DC

## 2012-02-10 NOTE — ED Provider Notes (Signed)
History     CSN: [id number]  Arrival date & time 02/10/12  1004   First MD Initiated Contact with Patient 02/10/12 1023      Chief Complaint  Patient presents with  . Abdominal Pain  . Urinary Frequency    (Consider location/radiation/quality/duration/timing/severity/associated sxs/prior treatment) The history is provided by the patient and a friend.  cc: 21 year old male coming in with multiple complaints including nausea vomiting diarrhea urinary frequency and lower, pain with a cough. Patient states that he has been vomiting since Monday and is unable to keep food down. States he's had diarrhea but none in the last 24 hours. His cough is productive and he's been having some post tussive emesis as well. He is a subjective fever but unsure how high his he has no thermometer. States his brother is sick as well with nausea vomiting and diarrhea. Patient appears nontoxic in no acute distress. Denies penile discharge or dysuria.  He has one partner but uses no condoms.  pmh anxiety, multiple std checks.  Smoker No past abdominal surgeries.  Frequent ER visits in the past no pcp.  History reviewed. No pertinent past medical history.  History reviewed. No pertinent past surgical history.  History reviewed. No pertinent family history.  History  Substance Use Topics  . Smoking status: Current Every Day Smoker -- 1.0 packs/day    Types: Cigarettes  . Smokeless tobacco: Not on file  . Alcohol Use: Yes     Comment: occ.      Review of Systems  Constitutional: Positive for fever, appetite change and fatigue.  HENT: Negative.  Negative for ear pain, sore throat, rhinorrhea, sneezing, trouble swallowing and postnasal drip.   Eyes: Negative.   Respiratory: Positive for cough. Negative for shortness of breath.   Cardiovascular: Negative.   Gastrointestinal: Negative.   Genitourinary: Positive for frequency. Negative for dysuria, urgency, discharge, scrotal swelling, difficulty  urinating, penile pain and testicular pain.  Neurological: Negative.  Negative for dizziness and light-headedness.  Psychiatric/Behavioral: Negative.   All other systems reviewed and are negative.    Allergies  Review of patient's allergies indicates no known allergies.  Home Medications   Current Outpatient Rx  Name  Route  Sig  Dispense  Refill  . ACETAMINOPHEN 325 MG PO TABS   Oral   Take 650 mg by mouth every 6 (six) hours as needed. migraine         . DIPHENHYDRAMINE HCL 25 MG PO CAPS   Oral   Take 25 mg by mouth every 4 (four) hours as needed. For itching.         Marland Kitchen HYDROXYZINE HCL 25 MG PO TABS   Oral   Take 1 tablet (25 mg total) by mouth every 6 (six) hours.   12 tablet   0   . PREDNISONE 20 MG PO TABS   Oral   Take 3 tablets (60 mg total) by mouth daily.   12 tablet   0     BP 132/89  Pulse 108  Temp 99.2 F (37.3 C) (Oral)  Resp 18  SpO2 100%  Physical Exam  Nursing note and vitals reviewed. Constitutional: He is oriented to person, place, and time. He appears well-developed and well-nourished.  HENT:  Head: Normocephalic.  Eyes: Conjunctivae normal and EOM are normal. Pupils are equal, round, and reactive to light.  Neck: Normal range of motion. Neck supple.  Cardiovascular: Normal rate, regular rhythm and normal heart sounds.   Pulmonary/Chest: Effort normal and breath  sounds normal. No respiratory distress. He has no wheezes. He has no rales.  Abdominal: Soft. Bowel sounds are normal. He exhibits no distension. There is tenderness.       General lower abdominal tenderness  Musculoskeletal: Normal range of motion.  Neurological: He is alert and oriented to person, place, and time. No cranial nerve deficit.  Skin: Skin is warm and dry. No rash noted. No erythema.  Psychiatric: He has a normal mood and affect.    ED Course  Procedures (including critical care time)   Labs Reviewed  URINALYSIS, ROUTINE W REFLEX MICROSCOPIC  BASIC  METABOLIC PANEL  CBC  GC/CHLAMYDIA PROBE AMP   No results found.   No diagnosis found.    MDM  N/v/d in the past 3 days intermittantly.  IVF in the ER today with zofran with relief.  No further n/v/d.  STD check pending per patient request.  Percocet or ibuprofen  for fever and zofran for nausea.  He will get pcp next week to follow up with or return to the ER for worsening symptoms.  Tolerating po's in the ER prior to discharge.  Agrees with plan ready for discharge.         Remi Haggard, NP 02/10/12 2206

## 2012-02-10 NOTE — ED Notes (Signed)
Pt escorted to discharge window. Verbalized understanding discharge instructions. In no acute distress.   

## 2012-02-10 NOTE — ED Notes (Signed)
Pt sts N/V/D and abdominal pain x 2 days.  Pt sts symptoms increase when he eats.

## 2012-02-10 NOTE — ED Notes (Signed)
Patient states that for the last few days he had N/V/d and abdominal pain. The N/V/D have resolved however he continues to have pain to his abdominal region and has a hard time making it to the restroom to urinate

## 2012-02-10 NOTE — ED Notes (Signed)
Pt given sandwich and Sprite, per NP.

## 2012-02-11 LAB — GC/CHLAMYDIA PROBE AMP
CT Probe RNA: NEGATIVE
GC Probe RNA: NEGATIVE

## 2012-02-12 NOTE — ED Provider Notes (Signed)
Medical screening examination/treatment/procedure(s) were performed by non-physician practitioner and as supervising physician I was immediately available for consultation/collaboration.  Norena Bratton, MD 02/12/12 0739 

## 2012-12-27 ENCOUNTER — Emergency Department (HOSPITAL_COMMUNITY): Payer: Self-pay

## 2012-12-27 ENCOUNTER — Emergency Department (HOSPITAL_COMMUNITY)
Admission: EM | Admit: 2012-12-27 | Discharge: 2012-12-27 | Disposition: A | Discharge status: Home / Self Care | Payer: Self-pay | Attending: Emergency Medicine | Admitting: Emergency Medicine

## 2012-12-27 ENCOUNTER — Encounter (HOSPITAL_COMMUNITY): Payer: Self-pay | Admitting: Emergency Medicine

## 2012-12-27 DIAGNOSIS — M545 Low back pain, unspecified: Secondary | ICD-10-CM

## 2012-12-27 DIAGNOSIS — F17200 Nicotine dependence, unspecified, uncomplicated: Secondary | ICD-10-CM | POA: Insufficient documentation

## 2012-12-27 DIAGNOSIS — R3915 Urgency of urination: Secondary | ICD-10-CM | POA: Insufficient documentation

## 2012-12-27 LAB — URINALYSIS, ROUTINE W REFLEX MICROSCOPIC
Bilirubin Urine: NEGATIVE
Glucose, UA: NEGATIVE mg/dL
Hgb urine dipstick: NEGATIVE
Ketones, ur: NEGATIVE mg/dL
Leukocytes, UA: NEGATIVE
Nitrite: NEGATIVE
Protein, ur: NEGATIVE mg/dL
Specific Gravity, Urine: 1.013 (ref 1.005–1.030)
Urobilinogen, UA: 1 mg/dL (ref 0.0–1.0)
pH: 8 (ref 5.0–8.0)

## 2012-12-27 NOTE — ED Provider Notes (Signed)
Care assumed from [place], PA-C  Danny Bishop presents with midline L-spine back pain 3 weeks, and complaints of intermittent dysuria and endorsement of one episode of urinary incontinence in the last 3 weeks.Marland Kitchen He reports that he has been having urinary urgency for one-year. He reports that the urgency requires that he urinate immediately and only once has he lost control of his bladder and urinated on himself.  He denies trauma to his back, IV drug use, immunosuppression or anticoagulants.  Face to face Exam:   General: Awake  HEENT: Atraumatic  Resp: Normal effort  Abd: Nondistended  Neuro: Speech is clear and goal oriented, follows commands Major Cranial nerves without deficit, no facial droop Normal strength in upper and lower extremities bilaterally including dorsiflexion and plantar flexion, strong and equal grip strength Sensation normal to light and sharp touch Moves extremities without ataxia, coordination intact Normal finger to nose and rapid alternating movements Neg romberg, no pronator drift Normal gait and balance Lymph: No adenopathy GU: Normal rectal tone on DRE, prostate not enlarged, boggy or tender.   labs/Image: Patient urinalysis without nitrites, leukocytes or white blood cells; no indication of urinary tract infection. L-spine without evidence of fracture or subluxation.  I personally reviewed the imaging tests through PACS system.  I reviewed available ER/hospitalization records through the EMR.  Bladder scan performed after urination with residual of only 20 cc.  7:22 PM Patient ambulates difficulty here in the emergency department. He is neurovascularly intact and has a normal neurologic exam. At this point there is no indication for cauda equina. I believe that his urgency and urge incontinence is likely due to a bladder or prostate problem. He reports no new sexual partners in the last year and no penile or testicular pain. He also denies penile  discharge. I think the likelihood of an STD is low.  Patient was discussed with Dr. Purvis Sheffield he believes the patient should be set up with the [facility] clinic for primary care and should have further evaluation of the symptoms. There is no indication for an emergent MRI at this time.  Patient with back pain.  No neurological deficits and normal neuro exam.  Patient can walk but states is painful.  No loss of bowel or bladder control.  No concern for cauda equina.  No fever, night sweats, weight loss, h/o cancer, IVDU.  RICE protocol and pain medicine indicated and discussed with patient.   It has been determined that no acute conditions requiring further emergency intervention are present at this time. The patient/guardian have been advised of the diagnosis and plan. We have discussed signs and symptoms that warrant return to the ED, such as changes or worsening in symptoms.   Vital signs are stable at discharge.   BP 134/86  Pulse 68  Temp(Src) 97.6 F (36.4 C) (Oral)  Resp 15  Ht 6' (1.829 m)  Wt 152 lb (68.947 kg)  BMI 20.61 kg/m2  SpO2 98%  Patient/guardian has voiced understanding and agreed to follow-up with the PCP or specialist.            Dierdre Forth, PA-C 12/27/12 1924

## 2012-12-27 NOTE — ED Notes (Signed)
Pt c/o lower back pain x 1 month; pt denies obvious injury

## 2012-12-27 NOTE — ED Provider Notes (Signed)
CSN: [id number]     Arrival date & time 12/27/12  1325 History   This chart was scribed for non-physician practitioner Arthor Captain, PA-C working with Geoffery Lyons, MD by Joaquin Music, ED Scribe. This patient was seen in room TR10C/TR10C and the patient's care was started at 3:22 PM .     Chief Complaint  Patient presents with  . Back Pain   The history is provided by the patient. No language interpreter was used.   HPI Comments: Danny Bishop is a 22 y.o. male who presents to the Emergency Department complaining of ongoing worsening lower back pain that's been going on for 3 weeks ago. Pt states he woke up with the pain 3 weeks ago and has never gone away or changed. Pt states lying flat and walking worsens the pain. He states duriing the day when he is awake,his pain is less but states the pain worsens at night when he is about to lay down.  He describes the pain as "unbearable" and state he is unable to sleep due to pain.  He states he was concerned that he may have a UTI and states he has been drinking cranberry juice. He states his pain has not changed and states the pain is getting worse as the days progress. Pt states he had an episode of incontinence last week. He states he had a sense of urgency and was unable to "hold his bladder". He has had multiple instances of urinary urgency. Pt denies hx of UTI. Pt states he has taken OTC Tylenol Extra Strength with no relief.Pt states his last visit to the ED, he had a UA done and hematuria was a finding in his urine.    Pt denies numbness, fever, emesis, diaphoresis, unexplained weight loss and rashes. Denies weakness, loss of bowel function or saddle anesthesia. Denies neck stiffness, headache,.  Denies fever or recent procedures to back or ivdu.   Pt is concerned with his health due to lack of sleep and his pain.  History reviewed. No pertinent past medical history. History reviewed. No pertinent past surgical history. History  reviewed. No pertinent family history. History  Substance Use Topics  . Smoking status: Current Every Day Smoker -- 1.00 packs/day    Types: Cigarettes  . Smokeless tobacco: Not on file  . Alcohol Use: Yes     Comment: occ.    Review of Systems  Constitutional: Negative for fever, diaphoresis and unexpected weight change.  Musculoskeletal: Positive for back pain.  Skin: Negative for rash.  Neurological: Negative for numbness.  All other systems reviewed and are negative.    Allergies  Review of patient's allergies indicates no known allergies.  Home Medications  No current outpatient prescriptions on file.  BP 147/89  Pulse 109  Temp(Src) 98.5 F (36.9 C) (Oral)  Resp 16  Ht 6' (1.829 m)  Wt 152 lb (68.947 kg)  BMI 20.61 kg/m2  SpO2 100%  Physical Exam  Nursing note and vitals reviewed. Constitutional: He is oriented to person, place, and time. He appears well-developed and well-nourished. No distress.  HENT:  Head: Normocephalic and atraumatic.  Eyes: EOM are normal.  Neck: Neck supple. No tracheal deviation present.  Cardiovascular: Normal rate.   Pulmonary/Chest: Effort normal. No respiratory distress.  Musculoskeletal: Normal range of motion.  Pain with flexion and extension of the spine. No pain with twisting or lateral flexion bilaterally. No CVA tenderness. Midline spinal tenderness. Bilateral SI joint tenderness. No tenderness to palpation paraspinal muscles. No  step offs. No deformities.   Neurological: He is alert and oriented to person, place, and time.  Skin: Skin is warm and dry.  Psychiatric: He has a normal mood and affect. His behavior is normal.    ED Course  Procedures DIAGNOSTIC STUDIES: Oxygen Saturation is 100% on RA, normal by my interpretation.    COORDINATION OF CARE: 3:31 PM-Discussed treatment plan which includes X-ray and UA. Pt agreed to plan.   Labs Review Labs Reviewed  URINALYSIS, ROUTINE W REFLEX MICROSCOPIC   Imaging  Review No results found.  EKG Interpretation   None      MDM   1. Lumbar back pain    3:33 PM BP 147/89  Pulse 109  Temp(Src) 98.5 F (36.9 C) (Oral)  Resp 16  Ht 6' (1.829 m)  Wt 152 lb (68.947 kg)  BMI 20.61 kg/m2  SpO2 100% Patient with constant LBP for the past 3 weeks. Unrelieved by medications. Constant, worse at night. Will begin with ua and plain films.  Patient workup is still pending. I have discussed the case with PA Muthersbaugh who will assume care of the patient.  I personally performed the services described in this documentation, which was scribed in my presence. The recorded information has been reviewed and is accurate.     Arthor Captain, PA-C 12/28/12 [phone]

## 2012-12-27 NOTE — ED Notes (Signed)
Patient urinated states "a lot". Ten minutes after patient urinated bladder scan patient results 18 ML and 20 ML Provider notified.

## 2012-12-27 NOTE — ED Provider Notes (Signed)
Medical screening examination/treatment/procedure(s) were performed by non-physician practitioner and as supervising physician I was immediately available for consultation/collaboration.  EKG Interpretation   None         Junius Argyle, MD 12/27/12 2020

## 2012-12-29 NOTE — ED Provider Notes (Signed)
Medical screening examination/treatment/procedure(s) were performed by non-physician practitioner and as supervising physician I was immediately available for consultation/collaboration.     Geoffery Lyons, MD 12/29/12 [phone]

## 2013-05-10 ENCOUNTER — Encounter (HOSPITAL_COMMUNITY): Payer: Self-pay | Admitting: Emergency Medicine

## 2013-05-10 ENCOUNTER — Emergency Department (HOSPITAL_COMMUNITY)
Admission: EM | Admit: 2013-05-10 | Discharge: 2013-05-10 | Disposition: A | Discharge status: Home / Self Care | Payer: Self-pay | Attending: Emergency Medicine | Admitting: Emergency Medicine

## 2013-05-10 DIAGNOSIS — F17200 Nicotine dependence, unspecified, uncomplicated: Secondary | ICD-10-CM | POA: Insufficient documentation

## 2013-05-10 DIAGNOSIS — Z760 Encounter for issue of repeat prescription: Secondary | ICD-10-CM | POA: Insufficient documentation

## 2013-05-10 DIAGNOSIS — Z4802 Encounter for removal of sutures: Secondary | ICD-10-CM | POA: Insufficient documentation

## 2013-05-10 DIAGNOSIS — M79609 Pain in unspecified limb: Secondary | ICD-10-CM | POA: Insufficient documentation
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
## 2013-05-10 NOTE — Discharge Instructions (Signed)
Continue taking your other pain medications as directed.  Do not take more than indicated on bottle. Follow up with your surgeon in [place].  If you cannot get an appt with them, may call Dr. Melvyn Novasrtmann in [place] to attempt to schedule follow-up.
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
## 2013-05-10 NOTE — ED Notes (Signed)
Pt states his pain is fine right now because he has been taking 2 pain pills at a time instead of the 1 pill at a time, which was prescribed. States he is almost out of his pain medication because he has had to double up on them.

## 2013-05-15 NOTE — ED Provider Notes (Signed)
Medical screening examination/treatment/procedure(s) were performed by non-physician practitioner and as supervising physician I was immediately available for consultation/collaboration.   EKG Interpretation None        Merrie RoofJohn David Derron Pipkins III, MD 05/15/13 [phone]

## 2014-02-15 HISTORY — PX: HAND SURGERY: SHX662

## 2014-05-24 ENCOUNTER — Emergency Department (HOSPITAL_COMMUNITY)
Admission: EM | Admit: 2014-05-24 | Discharge: 2014-05-25 | Disposition: A | Discharge status: Home / Self Care | Payer: Self-pay | Attending: Emergency Medicine | Admitting: Emergency Medicine

## 2014-05-24 ENCOUNTER — Encounter (HOSPITAL_COMMUNITY): Payer: Self-pay | Admitting: *Deleted

## 2014-05-24 ENCOUNTER — Emergency Department (HOSPITAL_COMMUNITY): Payer: Self-pay

## 2014-05-24 DIAGNOSIS — F419 Anxiety disorder, unspecified: Secondary | ICD-10-CM | POA: Insufficient documentation

## 2014-05-24 DIAGNOSIS — Z720 Tobacco use: Secondary | ICD-10-CM | POA: Insufficient documentation

## 2014-05-24 DIAGNOSIS — R0789 Other chest pain: Secondary | ICD-10-CM | POA: Insufficient documentation

## 2014-05-24 LAB — CBC WITH DIFFERENTIAL/PLATELET
Basophils Absolute: 0 10*3/uL (ref 0.0–0.1)
Basophils Relative: 0 % (ref 0–1)
Eosinophils Absolute: 0 10*3/uL (ref 0.0–0.7)
Eosinophils Relative: 1 % (ref 0–5)
HCT: 40.7 % (ref 39.0–52.0)
Hemoglobin: 14.3 g/dL (ref 13.0–17.0)
Lymphocytes Relative: 49 % — ABNORMAL HIGH (ref 12–46)
Lymphs Abs: 3.6 10*3/uL (ref 0.7–4.0)
MCH: 31.4 pg (ref 26.0–34.0)
MCHC: 35.1 g/dL (ref 30.0–36.0)
MCV: 89.5 fL (ref 78.0–100.0)
Monocytes Absolute: 0.7 10*3/uL (ref 0.1–1.0)
Monocytes Relative: 10 % (ref 3–12)
Neutro Abs: 2.8 10*3/uL (ref 1.7–7.7)
Neutrophils Relative %: 40 % — ABNORMAL LOW (ref 43–77)
Platelets: 256 10*3/uL (ref 150–400)
RBC: 4.55 MIL/uL (ref 4.22–5.81)
RDW: 13 % (ref 11.5–15.5)
WBC: 7.1 10*3/uL (ref 4.0–10.5)

## 2014-05-24 LAB — TROPONIN I: Troponin I: <0.03 ng/mL (ref <0.031)

## 2014-05-24 LAB — BASIC METABOLIC PANEL
Anion gap: 6 (ref 5–15)
BUN: 12 mg/dL (ref 6–23)
CO2: 30 mmol/L (ref 19–32)
Calcium: 9.3 mg/dL (ref 8.4–10.5)
Chloride: 104 mmol/L (ref 96–112)
Creatinine, Ser: 0.86 mg/dL (ref 0.50–1.35)
GFR calc Af Amer: >90 mL/min (ref >90)
GFR calc non Af Amer: >90 mL/min (ref >90)
Glucose, Bld: 140 mg/dL — ABNORMAL HIGH (ref 70–99)
Potassium: 3.5 mmol/L (ref 3.5–5.1)
Sodium: 140 mmol/L (ref 135–145)

## 2014-05-24 LAB — BRAIN NATRIURETIC PEPTIDE: B Natriuretic Peptide: 6.8 pg/mL (ref 0.0–100.0)

## 2014-05-24 NOTE — ED Notes (Signed)
The pt was sitting watching tv when he became sob and began to have mid-chest pain.  He denies chest pain and sob now.  He also denied smoking pot.  No whe feels muscle tightness

## 2014-05-24 NOTE — ED Provider Notes (Signed)
CSN: [id number]     Arrival date & time 05/24/14  2150 History   First MD Initiated Contact with Patient 05/24/14 2331     Chief Complaint  Patient presents with  . Shortness of Breath     (Consider location/radiation/quality/duration/timing/severity/associated sxs/prior Treatment) Patient is a 24 y.o. male presenting with shortness of breath. The history is provided by the patient. No language interpreter was used.  Shortness of Breath Severity:  Moderate Associated symptoms: no chest pain and no fever   Associated symptoms comment:  Sudden onset of a feeling he couldn't breathe, heart racing, sense of "something bad about to happen". Symptoms last about 45 minutes. No better with walking, rest. He is currently symptom-free. No recent fever or cough. No nausea, vomiting, diarrhea. He states he has been under significant stress recently.    History reviewed. No pertinent past medical history. History reviewed. No pertinent past surgical history. No family history on file. History  Substance Use Topics  . Smoking status: Current Every Day Smoker -- 1.00 packs/day    Types: Cigarettes  . Smokeless tobacco: Not on file  . Alcohol Use: Yes     Comment: occ.    Review of Systems  Constitutional: Negative for fever and chills.  HENT: Negative.   Respiratory: Positive for chest tightness and shortness of breath.   Cardiovascular: Negative for chest pain.  Gastrointestinal: Negative.   Musculoskeletal: Negative.   Neurological: Negative.   Psychiatric/Behavioral: The patient is nervous/anxious.       Allergies  Review of patient's allergies indicates no known allergies.  Home Medications   Prior to Admission medications   Not on File   BP 131/76 mmHg  Pulse 85  Resp 21  SpO2 98% Physical Exam  Constitutional: He is oriented to person, place, and time. He appears well-developed and well-nourished.  HENT:  Head: Normocephalic.  Neck: Normal range of motion. Neck supple.   Cardiovascular: Normal rate and regular rhythm.   Pulmonary/Chest: Effort normal and breath sounds normal. He has no wheezes. He has no rales. He exhibits no tenderness.  Abdominal: Soft. Bowel sounds are normal. There is no tenderness. There is no rebound and no guarding.  Musculoskeletal: Normal range of motion.  Neurological: He is alert and oriented to person, place, and time.  Skin: Skin is warm and dry. No rash noted.  Psychiatric: He has a normal mood and affect.    ED Course  Procedures (including critical care time) Labs Review Labs Reviewed  BASIC METABOLIC PANEL - Abnormal; Notable for the following:    Glucose, Bld 140 (*)    All other components within normal limits  CBC WITH DIFFERENTIAL/PLATELET - Abnormal; Notable for the following:    Neutrophils Relative % 40 (*)    Lymphocytes Relative 49 (*)    All other components within normal limits  BRAIN NATRIURETIC PEPTIDE  TROPONIN I   Results for orders placed or performed during the hospital encounter of 05/24/14  Basic metabolic panel  Result Value Ref Range   Sodium 140 135 - 145 mmol/L   Potassium 3.5 3.5 - 5.1 mmol/L   Chloride 104 96 - 112 mmol/L   CO2 30 19 - 32 mmol/L   Glucose, Bld 140 (H) 70 - 99 mg/dL   BUN 12 6 - 23 mg/dL   Creatinine, Ser 7.82 0.50 - 1.35 mg/dL   Calcium 9.3 8.4 - 95.6 mg/dL   GFR calc non Af Amer >90 >90 mL/min   GFR calc Af Amer >90 >  90 mL/min   Anion gap 6 5 - 15  BNP (order ONLY if patient complains of dyspnea/SOB AND you have documented it for THIS visit)  Result Value Ref Range   B Natriuretic Peptide 6.8 0.0 - 100.0 pg/mL  CBC with Differential  Result Value Ref Range   WBC 7.1 4.0 - 10.5 K/uL   RBC 4.55 4.22 - 5.81 MIL/uL   Hemoglobin 14.3 13.0 - 17.0 g/dL   HCT 81.140.7 91.439.0 - 78.252.0 %   MCV 89.5 78.0 - 100.0 fL   MCH 31.4 26.0 - 34.0 pg   MCHC 35.1 30.0 - 36.0 g/dL   RDW 95.613.0 21.311.5 - 08.615.5 %   Platelets 256 150 - 400 K/uL   Neutrophils Relative % 40 (L) 43 - 77 %    Neutro Abs 2.8 1.7 - 7.7 K/uL   Lymphocytes Relative 49 (H) 12 - 46 %   Lymphs Abs 3.6 0.7 - 4.0 K/uL   Monocytes Relative 10 3 - 12 %   Monocytes Absolute 0.7 0.1 - 1.0 K/uL   Eosinophils Relative 1 0 - 5 %   Eosinophils Absolute 0.0 0.0 - 0.7 K/uL   Basophils Relative 0 0 - 1 %   Basophils Absolute 0.0 0.0 - 0.1 K/uL  Troponin I  Result Value Ref Range   Troponin I <0.03 <0.031 ng/mL   Dg Chest 2 View  05/24/2014   CLINICAL DATA:  Shortness breath.  Palpitations.  Chest tightness.  EXAM: CHEST - 2 VIEW  COMPARISON:  Two-view chest 02/10/2012.  FINDINGS: The heart size is normal. The lungs are clear. The visualized soft tissues and bony thorax are unremarkable.  IMPRESSION: Negative two view chest x-ray   Electronically Signed   By: Marin Robertshristopher  Mattern M.D.   On: 05/24/2014 23:32    Imaging Review Dg Chest 2 View  05/24/2014   CLINICAL DATA:  Shortness breath.  Palpitations.  Chest tightness.  EXAM: CHEST - 2 VIEW  COMPARISON:  Two-view chest 02/10/2012.  FINDINGS: The heart size is normal. The lungs are clear. The visualized soft tissues and bony thorax are unremarkable.  IMPRESSION: Negative two view chest x-ray   Electronically Signed   By: Marin Robertshristopher  Mattern M.D.   On: 05/24/2014 23:32     EKG Interpretation None      MDM   Final diagnoses:  None    1. Anxiety/panic  The patient is comfortble, VSS now. Symptoms describe panic - short duration, palpitations, SOB, "impending doom" sense, with complete resolution now. Labs and imaging WNL and reassuring. Doubt ACS, PE. Stable for discharge home.     Elpidio AnisShari [place] Dube, PA-C 05/25/14 [phone]  Gilda Creasehristopher J Pollina, MD 05/25/14 [phone]

## 2014-05-24 NOTE — ED Notes (Signed)
Hs of a panic attack 5-6 years ago

## 2014-05-24 NOTE — ED Notes (Signed)
Patient transported to X-ray 

## 2014-05-25 MED ORDER — LORAZEPAM 1 MG PO TABS: 1.0000 mg | ORAL_TABLET | Freq: Two times a day (BID) | ORAL | Status: DC | PRN

## 2014-05-25 NOTE — Discharge Instructions (Signed)
Panic Attacks Panic attacks are sudden, short feelings of great fear or discomfort. You may have them for no reason when you are relaxed, when you are uneasy (anxious), or when you are sleeping.  HOME CARE  Take all your medicines as told.  Check with your doctor before starting new medicines.  Keep all doctor visits. GET HELP IF:  You are not able to take your medicines as told.  Your symptoms do not get better.  Your symptoms get worse. GET HELP RIGHT AWAY IF:  Your attacks seem different than your normal attacks.  You have thoughts about hurting yourself or others.  You take panic attack medicine and you have a side effect. MAKE SURE YOU:  Understand these instructions.  Will watch your condition.  Will get help right away if you are not doing well or get worse. Document Released: 03/06/2010 Document Revised: 11/22/2012 Document Reviewed: 09/15/2012 [facility] Patient Information 2015 [place], [place]. This information is not intended to replace advice given to you by your health care provider. Make sure you discuss any questions you have with your health care provider.  Emergency Department Resource Guide 1) Find a Doctor and Pay Out of Pocket Although you won't have to find out who is covered by your insurance plan, it is a good idea to ask around and get recommendations. You will then need to call the office and see if the doctor you have chosen will accept you as a new patient and what types of options they offer for patients who are self-pay. Some doctors offer discounts or will set up payment plans for their patients who do not have insurance, but you will need to ask so you aren't surprised when you get to your appointment.  2) Contact Your Local Health Department Not all health departments have doctors that can see patients for sick visits, but many do, so it is worth a call to see if yours does. If you don't know where your local health department is, you can check in  your phone book. The CDC also has a tool to help you locate your state's health department, and many state websites also have listings of all of their local health departments.  3) Find a [facility] If your illness is not likely to be very severe or complicated, you may want to try a walk in clinic. These are popping up all over the country in pharmacies, drugstores, and shopping centers. They're usually staffed by nurse practitioners or physician assistants that have been trained to treat common illnesses and complaints. They're usually fairly quick and inexpensive. However, if you have serious medical issues or chronic medical problems, these are probably not your best option.  No Primary Care Doctor: - Call Health Connect at  [phone] - they can help you locate a primary care doctor that  accepts your insurance, provides certain services, etc. - Physician Referral Service- [phone]  Chronic Pain Problems: Organization         Address  Phone   Notes  [facility]  [phone] Patients need to be referred by their primary care doctor.   Medication Assistance: Organization         Address  Phone   Notes  [facility] Medication [facility] [address] [place], [place] [address] [phone] --Must be a resident of [facility] -- Must have NO insurance coverage whatsoever (no Medicaid/ Medicare, etc.) -- The pt. MUST have a primary care doctor that  directs their care regularly and follows them in the community   MedAssist  [phone]   [employer]  [phone]    Agencies that provide inexpensive medical care: Organization         Address  Phone   Notes  [facility]  [phone]   [facility]    [phone]   [facility] [address] [place], [place] [address] [phone]   [facility] [address] [place] [place] [phone]    Planned Parenthood    [phone]   [facility]    [phone]   Community [facility] and [facility]  [address], [place] Phone:  [phone], Fax:  [phone] Hours of Operation:  9 am - 6 pm, M-F.  Also accepts Medicaid/Medicare and self-pay.  [facility]  [address], [place] Phone: [phone], Fax: [phone]. Hours of Operation:  8:30 am - 5:30 pm, M-F.  Also accepts Medicaid and self-pay.  [facility] [place] [address] [place] Phone: [phone]   [facility] [address] Natasha Bence [place], [place] [phone], Ext. [phone] Mondays & Thursdays: 7-9 AM.  First 15 patients are seen on a first come, first serve basis.    Medicaid-accepting [facility] Providers:  Organization         Address  Phone   Notes  [facility] [address], [place] [phone] Also accepts self-pay patients.  [facility] [address] Laurell Josephs [place], [place]  [phone]   [facility] [address], [place] [phone]   [facility] [address] [place] [phone]   Renaye Rakers [address], [place]   [phone] Only accepts [place] patients after they have their name applied to their card.   Self-Pay (no insurance) in [facility]:  Organization         Address  Phone   Notes  Sickle Cell Patients, [facility] [address] [place], [place] [phone]   [facility] Care [address] [place], [place] [phone]   [facility] [place]  [address], [place] [phone]   [facility]/Dr. Osei-Bonsu  [address] [place] or [address], [place] [phone] Phone number for both [place] and [place] locations is the  same.  Urgent Medical and [facility] [address], [place] [phone]   [facility] [address], [place] or [address] [phone]   [facility] [address], [place] [phone], phone; [phone], fax Sees patients 1st and 3rd Saturday of every month.  Must not qualify for public or private insurance (i.e. Medicaid, Medicare,  Health Choice, Veterans' Benefits)  Household income should be no more than 200% of the poverty level The clinic cannot treat you if you are pregnant or think you are pregnant  Sexually transmitted diseases are not treated at the clinic.    Behavioral Health Resources in the Community: Intensive Outpatient Programs Organization         Address  Phone  Notes  [facility] [address], [place], [place]  [phone]   [facility] [address], [place], [place] [phone]   [facility] [address], [place], [place]  [phone]   [facility] [address],  [place], [place] [phone] or [phone]   Substance Abuse Resources Organization         Address  Phone  Notes  [facility]  [phone]   [facility]  [phone]   [facility]  [phone]   [facility]  [phone]   Residential & Outpatient Substance Abuse Program  [phone]   Psychological Services Organization         Address  Phone  Notes  [facility]  [phone]- [phone]   [facility]  [phone]- [phone]   [facility] [address], [place] [phone] or [phone]    Mobile Crisis Teams Organization         Address  Phone  Notes  [facility], Mobile Crisis Care Unit  [phone]   [facility]  [address] [place], [place] [phone]   Doristine LocksSharon DeEsch [address] [place]  [phone]    Self-Help/Support Groups Organization         Address  Phone             Notes  [facility] [place] - variety of support groups  336- [phone] Call for more information  Narcotics Anonymous (NA), [facility] [address], [place]  2 meetings at this location   Statisticianesidential Treatment Programs Organization         Address  Phone  Notes  [facility] [address],    [place]  [phone]   [facility]  [address], [place] [phone], [place], [place] [phone]   [facility] [address], [place] [phone] Admissions: 8am-3pm M-F  [facility] [address]    [place], [place] [phone]   [facility] [address], [place], [place] [phone]   [facility] [address]  [place], [place] [phone]   [facility] - Intensive Outpatient [address], [place], [place] [phone]   [facility] ([facility] [address]  [place], [place] [phone] or [phone]   [facility] ([facility]) [address] [place], [place] [phone] Accepts Medicaid  [facility] [address]  [place] [phone] Substance Abuse/Addiction Treatment   [facility] Resources Organization         Address  Phone  Notes  [facility]  [phone]   Angie FavaJulie Brannon, PhD [address] [place], [place]   [phone] or [phone](336) [phone]   [facility]   [address] [place], [place] [phone]) [phone]   [facility] [address], [place], [place] [phone] Insurance/Medicaid/sponsorship through [facility] [address]                                    [place], [place] [phone] Therapy/tele-psych/case  [facility] [address]   [place], [place] [phone]) [phone]    Dr. Lolly MustacheArfeen  [phone])  [phone]   [facility] [place]  [employer] [facility] 1) [address], [place] 2) [address] [facility]  [address], [place] °3)  [address], [place] [phone] °[phone] ° °[phone]   °[place] Child Abuse Hotline [phone]) [phone] or ([phone] (After Hours)    ° ° ° °

## 2014-05-25 NOTE — ED Notes (Signed)
Pt ambulated while monitoring pulse oximetry. SpO2 remained 100%

## 2015-04-07 ENCOUNTER — Emergency Department (HOSPITAL_COMMUNITY)
Admission: EM | Admit: 2015-04-07 | Discharge: 2015-04-08 | Disposition: A | Discharge status: Home / Self Care | Payer: Self-pay

## 2015-04-07 NOTE — ED Notes (Addendum)
RN went to triage pt and pt wishing to leave, I think I feel better and am going to go. Pt in nad out of eD.

## 2015-07-13 ENCOUNTER — Encounter (HOSPITAL_COMMUNITY): Payer: Self-pay

## 2015-07-13 ENCOUNTER — Emergency Department (HOSPITAL_COMMUNITY)
Admission: EM | Admit: 2015-07-13 | Discharge: 2015-07-13 | Disposition: A | Discharge status: Home / Self Care | Payer: Self-pay | Attending: Emergency Medicine | Admitting: Emergency Medicine

## 2015-07-13 DIAGNOSIS — Z202 Contact with and (suspected) exposure to infections with a predominantly sexual mode of transmission: Secondary | ICD-10-CM | POA: Insufficient documentation

## 2015-07-13 DIAGNOSIS — F17210 Nicotine dependence, cigarettes, uncomplicated: Secondary | ICD-10-CM | POA: Insufficient documentation

## 2015-07-13 LAB — URINE MICROSCOPIC-ADD ON: Bacteria, UA: NONE SEEN

## 2015-07-13 LAB — URINALYSIS, ROUTINE W REFLEX MICROSCOPIC
Bilirubin Urine: NEGATIVE
Glucose, UA: NEGATIVE mg/dL
Hgb urine dipstick: NEGATIVE
Ketones, ur: NEGATIVE mg/dL
Nitrite: NEGATIVE
Protein, ur: NEGATIVE mg/dL
Specific Gravity, Urine: 1.013 (ref 1.005–1.030)
pH: 7.5 (ref 5.0–8.0)

## 2015-07-13 LAB — RAPID HIV SCREEN (HIV 1/2 AB+AG)
HIV 1/2 Antibodies: NONREACTIVE
HIV-1 P24 Antigen - HIV24: NONREACTIVE

## 2015-07-13 MED ORDER — CEFTRIAXONE SODIUM 250 MG IJ SOLR
250.0000 mg | Freq: Once | INTRAMUSCULAR | Status: AC
  Administered 2015-07-13: 250 mg via INTRAMUSCULAR
  Filled 2015-07-13: qty 250

## 2015-07-13 MED ORDER — AZITHROMYCIN 250 MG PO TABS
1000.0000 mg | ORAL_TABLET | Freq: Once | ORAL | Status: AC
  Administered 2015-07-13: 1000 mg via ORAL
  Filled 2015-07-13: qty 4

## 2015-07-13 NOTE — ED Notes (Signed)
Patient was informed he has gonorrhea from previous partner. Reports some pain post voiding and minor discharge

## 2015-07-13 NOTE — ED Provider Notes (Signed)
CSN: [id number]     Arrival date & time 07/13/15  1800 History   First MD Initiated Contact with Patient 07/13/15 1821     Chief Complaint  Patient presents with  . Penile Discharge     (Consider location/radiation/quality/duration/timing/severity/associated sxs/prior Treatment) HPI   25 year old male with prior history of STD presents with concerning for STD. Patient states for the past 2-1/2 weeks he has noticed mild discomfort with urination, mild penile discharge and recently his wife informed him that he should be tested for STD as she was diagnosed with gonorrhea. He admits that he has been sexually active with 4 separate partners within the past 6 months not using protection. He denies having any fever, abdominal pain, back pain, hematuria, scrotal pain, testicular pain, or rash. He is not allergic to any medication.  History reviewed. No pertinent past medical history. History reviewed. No pertinent past surgical history. No family history on file. Social History  Substance Use Topics  . Smoking status: Current Every Day Smoker -- 1.00 packs/day    Types: Cigarettes  . Smokeless tobacco: None  . Alcohol Use: Yes     Comment: occ.    Review of Systems  All other systems reviewed and are negative.     Allergies  Review of patient's allergies indicates no known allergies.  Home Medications   Prior to Admission medications   Medication Sig Start Date End Date Taking? Authorizing Provider  LORazepam (ATIVAN) 1 MG tablet Take 1 tablet (1 mg total) by mouth 2 (two) times daily as needed for anxiety. 05/25/14   Shari Upstill, PA-C   BP 148/101 mmHg  Pulse 91  Temp(Src) 97.7 F (36.5 C) (Oral)  Resp 18  SpO2 99% Physical Exam  Constitutional: He appears well-developed and well-nourished. No distress.  HENT:  Head: Atraumatic.  Eyes: Conjunctivae are normal.  Neck: Neck supple.  Abdominal: Soft. There is no tenderness.  Genitourinary:  Chaperone present during exam.  No inguinal lymphadenopathy or inguinal hernia noted, normal circumcised penis without rash or obvious discharge. Testicle with normal lie, nontender to palpation, scrotum is normal, perineum is soft.  Neurological: He is alert.  Skin: No rash noted.  Psychiatric: He has a normal mood and affect.  Nursing note and vitals reviewed.   ED Course  Procedures (including critical care time) Labs Review Labs Reviewed  URINALYSIS, ROUTINE W REFLEX MICROSCOPIC (NOT AT [facility])  RPR  RAPID HIV SCREEN (HIV 1/2 AB+AG)  GC/CHLAMYDIA PROBE AMP ([place]) NOT AT [facility]    Imaging Review No results found. I have personally reviewed and evaluated these images and lab results as part of my medical decision-making.   EKG Interpretation None      MDM   Final diagnoses:  STD exposure   BP 148/101 mmHg  Pulse 91  Temp(Src) 97.7 F (36.5 C) (Oral)  Resp 18  SpO2 99%   6:31 PM Patient with recent STD exposure here with urinary discomfort and penile discharge. He exhibits high risk sexual behaviors. Will prophylactically provide antibiotics including Rocephin and Zithromax as well as performing an STD screen.  Fayrene HelperBowie Kenndra Morris, PA-C 07/13/15 1934  Lyndal Pulleyaniel Knott, MD 07/14/15 0200

## 2015-07-13 NOTE — Discharge Instructions (Signed)
Sexually Transmitted Disease  A sexually transmitted disease (STD) is a disease or infection that may be passed (transmitted) from person to person, usually during sexual activity. This may happen by way of saliva, semen, blood, vaginal mucus, or urine. Common STDs include:  · Gonorrhea.  · Chlamydia.  · Syphilis.  · HIV and AIDS.  · Genital herpes.  · Hepatitis B and C.  · Trichomonas.  · Human papillomavirus (HPV).  · Pubic lice.  · Scabies.  · Mites.  · Bacterial vaginosis.  WHAT ARE CAUSES OF STDs?  An STD may be caused by bacteria, a virus, or parasites. STDs are often transmitted during sexual activity if one person is infected. However, they may also be transmitted through nonsexual means. STDs may be transmitted after:   · Sexual intercourse with an infected person.  · Sharing sex toys with an infected person.  · Sharing needles with an infected person or using unclean piercing or tattoo needles.  · Having intimate contact with the genitals, mouth, or rectal areas of an infected person.  · Exposure to infected fluids during birth.  WHAT ARE THE SIGNS AND SYMPTOMS OF STDs?  Different STDs have different symptoms. Some people may not have any symptoms. If symptoms are present, they may include:  · Painful or bloody urination.  · Pain in the pelvis, abdomen, vagina, anus, throat, or eyes.  · A skin rash, itching, or irritation.  · Growths, ulcerations, blisters, or sores in the genital and anal areas.  · Abnormal vaginal discharge with or without bad odor.  · Penile discharge in men.  · Fever.  · Pain or bleeding during sexual intercourse.  · Swollen glands in the groin area.  · Yellow skin and eyes (jaundice). This is seen with hepatitis.  · Swollen testicles.  · Infertility.  · Sores and blisters in the mouth.  HOW ARE STDs DIAGNOSED?  To make a diagnosis, your health care provider may:  · Take a medical history.  · Perform a physical exam.  · Take a sample of any discharge to examine.  · Swab the throat,  cervix, opening to the penis, rectum, or vagina for testing.  · Test a sample of your first morning urine.  · Perform blood tests.  · Perform a Pap test, if this applies.  · Perform a colposcopy.  · Perform a laparoscopy.  HOW ARE STDs TREATED?  Treatment depends on the STD. Some STDs may be treated but not cured.  · Chlamydia, gonorrhea, trichomonas, and syphilis can be cured with antibiotic medicine.  · Genital herpes, hepatitis, and HIV can be treated, but not cured, with prescribed medicines. The medicines lessen symptoms.  · Genital warts from HPV can be treated with medicine or by freezing, burning (electrocautery), or surgery. Warts may come back.  · HPV cannot be cured with medicine or surgery. However, abnormal areas may be removed from the cervix, vagina, or vulva.  · If your diagnosis is confirmed, your recent sexual partners need treatment. This is true even if they are symptom-free or have a negative culture or evaluation. They should not have sex until their health care providers say it is okay.  · Your health care provider may test you for infection again 3 months after treatment.  HOW CAN I REDUCE MY RISK OF GETTING AN STD?  Take these steps to reduce your risk of getting an STD:  · Use latex condoms, dental dams, and water-soluble lubricants during sexual activity. Do not use   petroleum jelly or oils.  · Avoid having multiple sex partners.  · Do not have sex with someone who has other sex partners  · Do not have sex with anyone you do not know or who is at high risk for an STD.  · Avoid risky sex practices that can break your skin.  · Do not have sex if you have open sores on your mouth or skin.  · Avoid drinking too much alcohol or taking illegal drugs. Alcohol and drugs can affect your judgment and put you in a vulnerable position.  · Avoid engaging in oral and anal sex acts.  · Get vaccinated for HPV and hepatitis. If you have not received these vaccines in the past, talk to your health care  provider about whether one or both might be right for you.  · If you are at risk of being infected with HIV, it is recommended that you take a prescription medicine daily to prevent HIV infection. This is called pre-exposure prophylaxis (PrEP). You are considered at risk if:    You are a man who has sex with other men (MSM).    You are a heterosexual man or woman and are sexually active with more than one partner.    You take drugs by injection.    You are sexually active with a partner who has HIV.  · Talk with your health care provider about whether you are at high risk of being infected with HIV. If you choose to begin PrEP, you should first be tested for HIV. You should then be tested every 3 months for as long as you are taking PrEP.  WHAT SHOULD I DO IF I THINK I HAVE AN STD?  · See your health care provider.  · Tell your sexual partner(s). They should be tested and treated for any STDs.  · Do not have sex until your health care provider says it is okay.  WHEN SHOULD I GET IMMEDIATE MEDICAL CARE?  Contact your health care provider right away if:   · You have severe abdominal pain.  · You are a man and notice swelling or pain in your testicles.  · You are a woman and notice swelling or pain in your vagina.     This information is not intended to replace advice given to you by your health care provider. Make sure you discuss any questions you have with your health care provider.     Document Released: 04/24/2002 Document Revised: 02/22/2014 Document Reviewed: 08/22/2012  Elsevier Interactive Patient Education ©2016 [employer]    Safe Sex  Safe sex is about reducing the risk of giving or getting a sexually transmitted disease (STD). STDs are spread through sexual contact involving the genitals, mouth, or rectum. Some STDs can be cured and others cannot. Safe sex can also prevent unintended pregnancies.   WHAT ARE SOME SAFE SEX PRACTICES?  · Limit your sexual activity to only one partner who is having sex with  only you.  · Talk to your partner about his or her past partners, past STDs, and drug use.  · Use a condom every time you have sexual intercourse. This includes vaginal, oral, and anal sexual activity. Both females and males should wear condoms during oral sex. Only use latex or polyurethane condoms and water-based lubricants. Using petroleum-based lubricants or oils to lubricate a condom will weaken the condom and increase the chance that it will break. The condom should be in place from the beginning to   the end of sexual activity. Wearing a condom reduces, but does not completely eliminate, your risk of getting or giving an STD. STDs can be spread by contact with infected body fluids and skin.  · Get vaccinated for hepatitis B and HPV.  · Avoid alcohol and recreational drugs, which can affect your judgment. You may forget to use a condom or participate in high-risk sex.  · For females, avoid douching after sexual intercourse. Douching can spread an infection farther into the reproductive tract.  · Check your body for signs of sores, blisters, rashes, or unusual discharge. See your health care provider if you notice any of these signs.  · Avoid sexual contact if you have symptoms of an infection or are being treated for an STD. If you or your partner has herpes, avoid sexual contact when blisters are present. Use condoms at all other times.  · If you are at risk of being infected with HIV, it is recommended that you take a prescription medicine daily to prevent HIV infection. This is called pre-exposure prophylaxis (PrEP). You are considered at risk if:    You are a man who has sex with other men (MSM).    You are a heterosexual man or woman who is sexually active with more than one partner.    You take drugs by injection.    You are sexually active with a partner who has HIV.  · Talk with your health care provider about whether you are at high risk of being infected with HIV. If you choose to begin PrEP, you  should first be tested for HIV. You should then be tested every 3 months for as long as you are taking PrEP.  · See your health care provider for regular screenings, exams, and tests for other STDs. Before having sex with a new partner, each of you should be screened for STDs and should talk about the results with each other.  WHAT ARE THE BENEFITS OF SAFE SEX?   · There is less chance of getting or giving an STD.  · You can prevent unwanted or unintended pregnancies.  · By discussing safe sex concerns with your partner, you may increase feelings of intimacy, comfort, trust, and honesty between the two of you.     This information is not intended to replace advice given to you by your health care provider. Make sure you discuss any questions you have with your health care provider.     Document Released: 03/11/2004 Document Revised: 02/22/2014 Document Reviewed: 07/26/2011  Elsevier Interactive Patient Education ©2016 [employer]

## 2015-07-14 LAB — RPR: RPR Ser Ql: NONREACTIVE

## 2015-07-15 LAB — GC/CHLAMYDIA PROBE AMP (~~LOC~~) NOT AT ARMC
Chlamydia: POSITIVE — AB
Neisseria Gonorrhea: NEGATIVE

## 2015-07-16 ENCOUNTER — Telehealth (HOSPITAL_BASED_OUTPATIENT_CLINIC_OR_DEPARTMENT_OTHER): Payer: Self-pay | Admitting: Emergency Medicine

## 2015-09-02 ENCOUNTER — Telehealth: Payer: Self-pay | Admitting: *Deleted

## 2015-09-02 NOTE — Telephone Encounter (Signed)
(+)  STD, no response to phone or letter sent to home.  Unable to notify of (+) results.

## 2016-01-02 IMAGING — CR DG CHEST 2V
2 series · 2 of 2 positions shown · non-contrast
Comparison: Two-view chest 02/10/2012.

CLINICAL DATA: Shortness breath.  Palpitations.  Chest tightness.

EXAM:
CHEST - 2 VIEW

[chest pa]
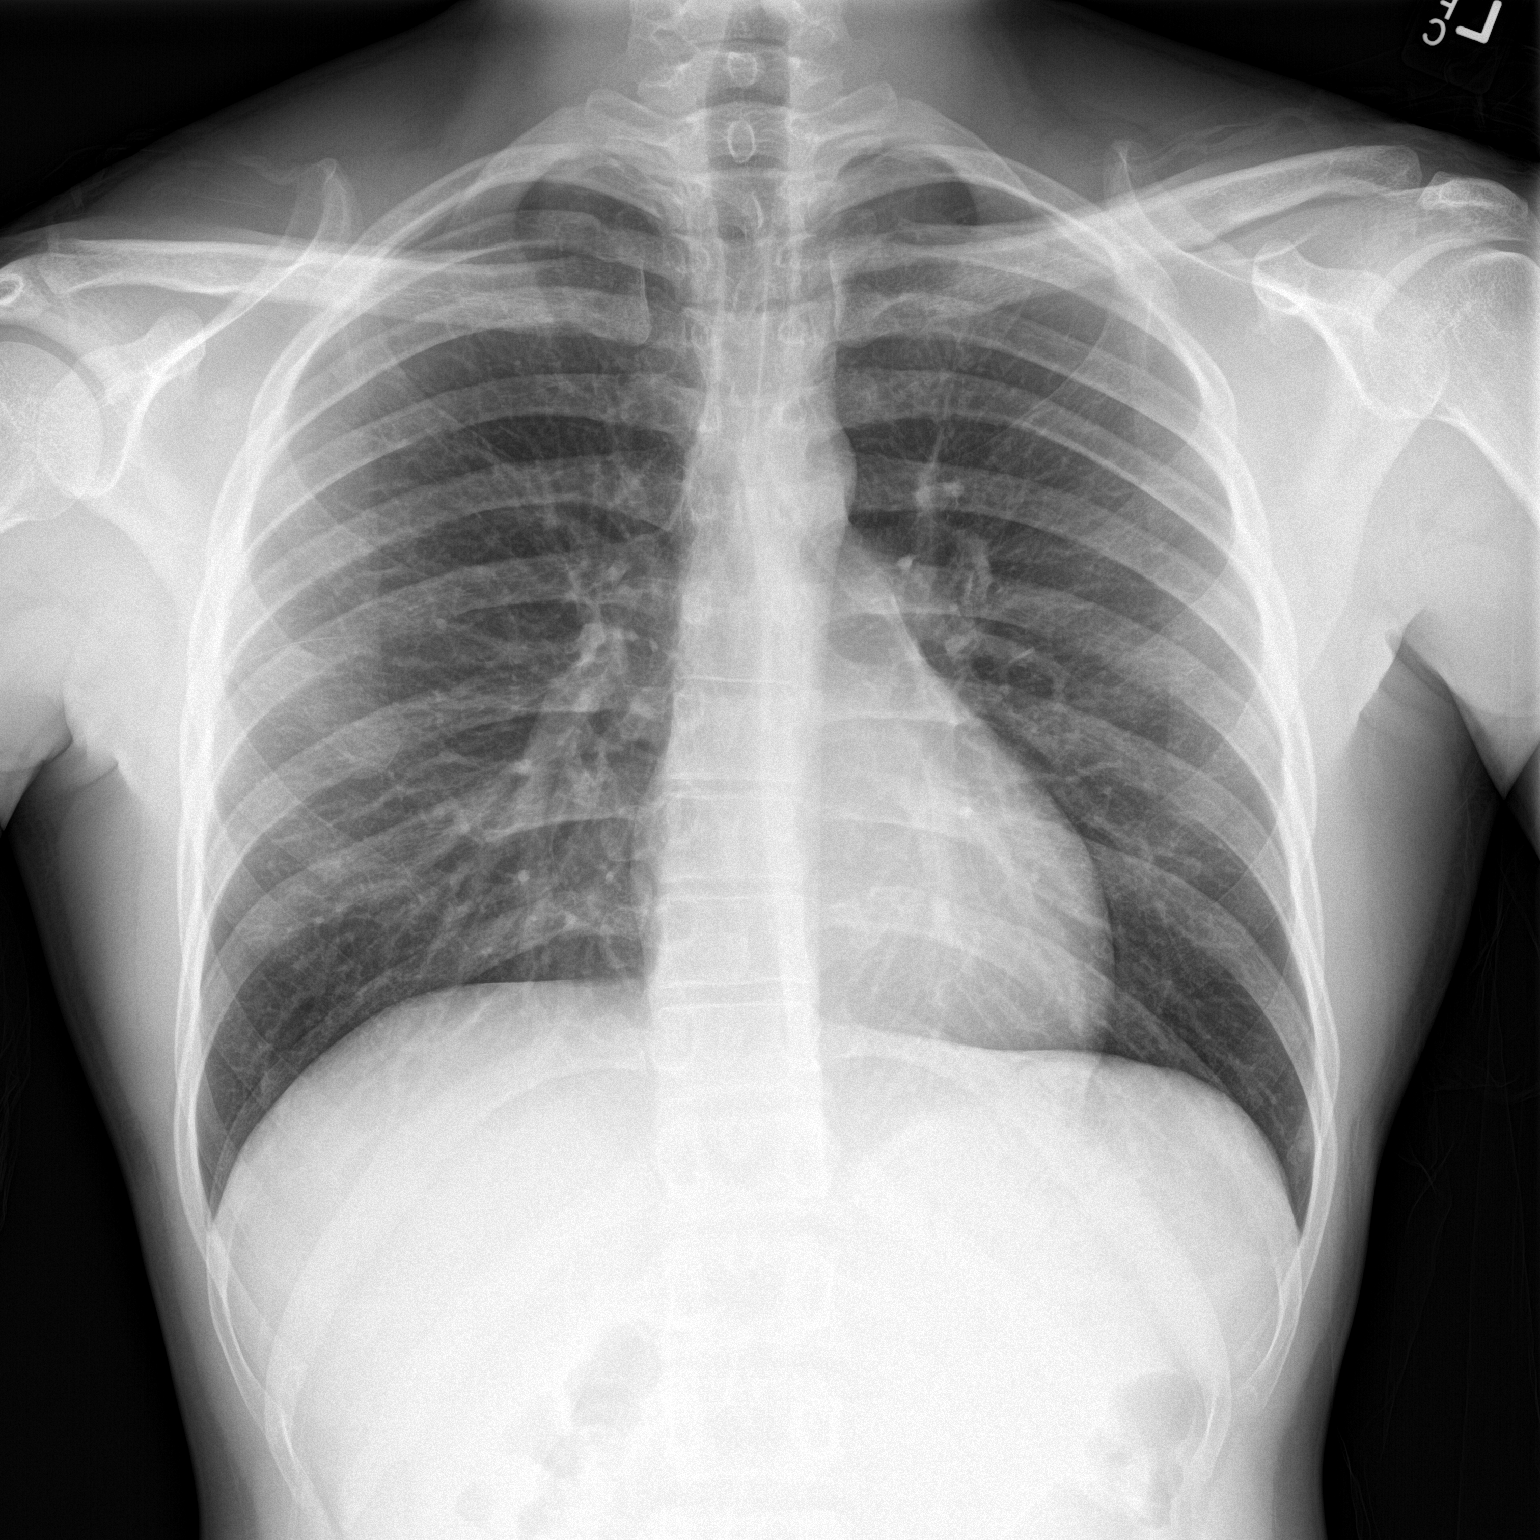

[chest lat]
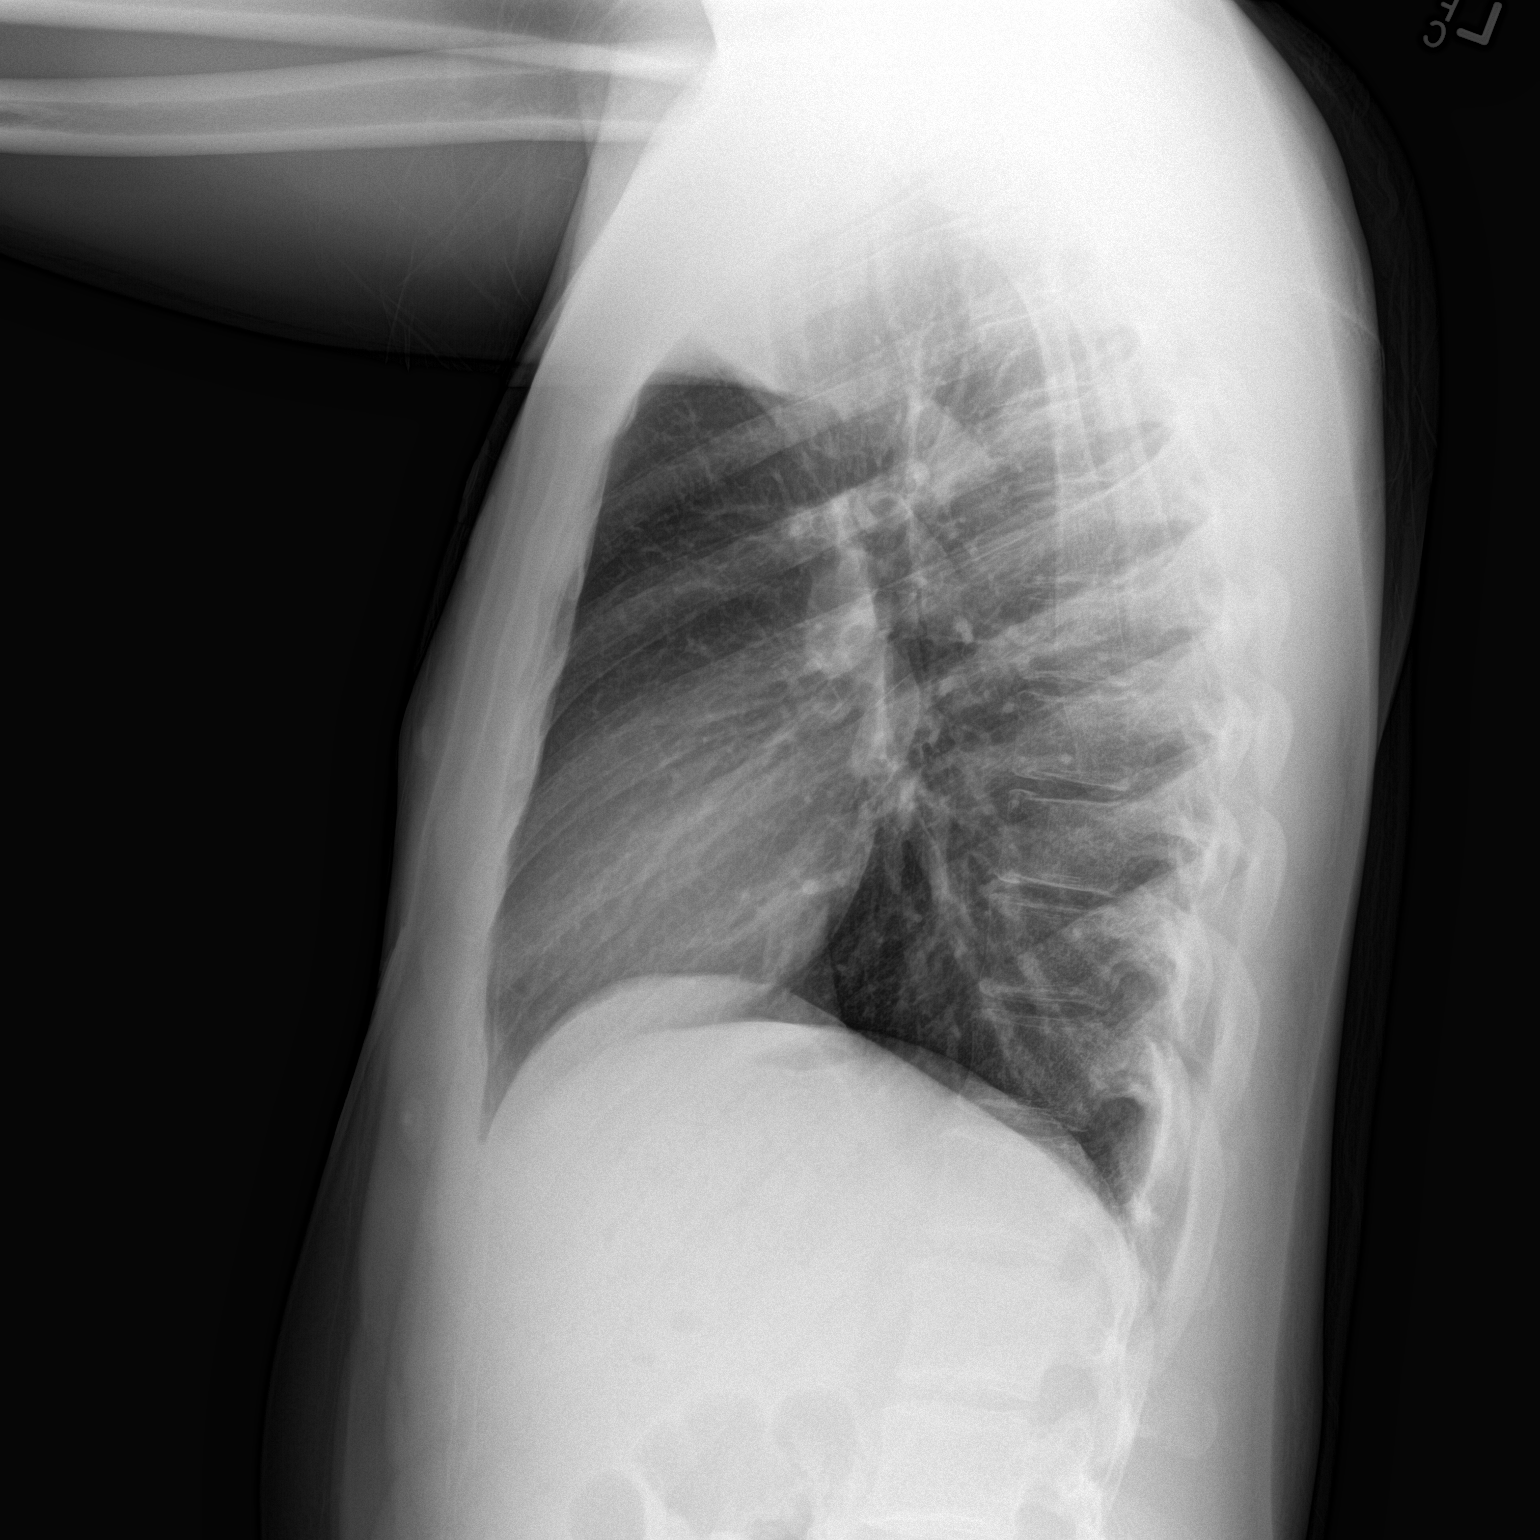

[2 of 2 positions shown; findings below may reference images not displayed]

FINDINGS: The heart size is normal. The lungs are clear. The visualized soft
tissues and bony thorax are unremarkable.
IMPRESSION: Negative two view chest x-ray

## 2016-03-01 ENCOUNTER — Encounter (HOSPITAL_COMMUNITY): Payer: Self-pay

## 2016-03-01 ENCOUNTER — Emergency Department (HOSPITAL_COMMUNITY)
Admission: EM | Admit: 2016-03-01 | Discharge: 2016-03-02 | Disposition: A | Discharge status: Home / Self Care | Payer: Self-pay | Attending: Emergency Medicine | Admitting: Emergency Medicine

## 2016-03-01 DIAGNOSIS — K0401 Reversible pulpitis: Secondary | ICD-10-CM | POA: Insufficient documentation

## 2016-03-01 DIAGNOSIS — I10 Essential (primary) hypertension: Secondary | ICD-10-CM | POA: Insufficient documentation

## 2016-03-01 DIAGNOSIS — F17210 Nicotine dependence, cigarettes, uncomplicated: Secondary | ICD-10-CM | POA: Insufficient documentation

## 2016-03-01 DIAGNOSIS — K029 Dental caries, unspecified: Secondary | ICD-10-CM | POA: Insufficient documentation

## 2016-03-01 HISTORY — DX: Essential (primary) hypertension: I10

## 2016-03-01 NOTE — ED Triage Notes (Signed)
Pt endorses right upper dental pain with facial swelling x 3 days. Pt states he took otc meds without relief. Pt has broken right upper molar. Unable to get tooth pulled right now due to financial reasons.

## 2016-03-02 MED ORDER — IBUPROFEN 800 MG PO TABS: 800.0000 mg | ORAL_TABLET | Freq: Four times a day (QID) | ORAL | 0 refills | Status: DC | PRN

## 2016-03-02 MED ORDER — BENZOCAINE 10 % MT GEL: 1.0000 "application " | OROMUCOSAL | 0 refills | Status: DC | PRN

## 2016-03-02 NOTE — ED Provider Notes (Signed)
[facility]-EMERGENCY DEPT Provider Note   CSN: [id number] Arrival date & time: 03/01/16  2124     History   Chief Complaint Chief Complaint  Patient presents with  . Dental Pain    HPI Danny Bishop is a 26 y.o. male.  The history is provided by the patient.  Dental Pain   This is a recurrent problem. The current episode started more than 2 days ago. The problem occurs constantly. The problem has not changed since onset.The pain is moderate (worse at night). He has tried nothing for the symptoms.    Past Medical History:  Diagnosis Date  . Hypertension     Patient Active Problem List   Diagnosis Date Noted  . Anxiety attack 04/16/2011  . Anxiety disorder 04/16/2011  . Weight loss, unintentional 04/16/2011  . Diarrhea in adult patient 04/16/2011    History reviewed. No pertinent surgical history.     Home Medications    Prior to Admission medications   Medication Sig Start Date End Date Taking? Authorizing Provider  LORazepam (ATIVAN) 1 MG tablet Take 1 tablet (1 mg total) by mouth 2 (two) times daily as needed for anxiety. 05/25/14   Elpidio AnisShari Upstill, PA-C    Family History History reviewed. No pertinent family history.  Social History Social History  Substance Use Topics  . Smoking status: Current Every Day Smoker    Packs/day: 1.00    Types: Cigarettes  . Smokeless tobacco: Never Used  . Alcohol use Yes     Comment: occ.     Allergies   Patient has no known allergies.   Review of Systems Review of Systems  All other systems reviewed and are negative.    Physical Exam Updated Vital Signs BP (!) 161/113 (BP Location: Right Arm)   Pulse 90   Temp 98.4 F (36.9 C) (Oral)   Resp 18   Ht 6' (1.829 m)   Wt 157 lb (71.2 kg)   SpO2 100%   BMI 21.29 kg/m   Physical Exam  Constitutional: He is oriented to person, place, and time. He appears well-developed and well-nourished. No distress.  HENT:  Head: Normocephalic and atraumatic.  Nose: Nose  normal.  Mouth/Throat: No oral lesions. No trismus in the jaw. Abnormal dentition. Dental caries present. No dental abscesses or uvula swelling.    Eyes: Conjunctivae are normal.  Neck: Neck supple. No tracheal deviation present.  Cardiovascular: Normal rate and regular rhythm.   Pulmonary/Chest: Effort normal. No respiratory distress.  Abdominal: Soft. He exhibits no distension.  Neurological: He is alert and oriented to person, place, and time.  Skin: Skin is warm and dry.  Psychiatric: He has a normal mood and affect.     ED Treatments / Results  Labs (all labs ordered are listed, but only abnormal results are displayed) Labs Reviewed - No data to display  EKG  EKG Interpretation None       Radiology No results found.  Procedures Procedures (including critical care time)  Medications Ordered in ED Medications - No data to display   Initial Impression / Assessment and Plan / ED Course  I have reviewed the triage vital signs and the nursing notes.  Pertinent labs & imaging results that were available during my care of the patient were reviewed by me and considered in my medical decision making (see chart for details).  Clinical Course     26 y.o. male presents with dental pain of right upper side with visible breakdown of tooth and  decay with exposed nerve root. Suspect uncomplicated pulpitis. No signs or symptoms of infection. No facial swelling. Recommended scheduled NSAIDs for pain control, Pt has been aggressively rinsing mouth and chewing on goody powder, which I recommended he discontinue. Needs f/u with dentistry for extraction or other intervention.   Final Clinical Impressions(s) / ED Diagnoses   Final diagnoses:  Reversible pulpitis  Dental caries    New Prescriptions Discharge Medication List as of 03/02/2016 12:06 AM    START taking these medications   Details  benzocaine (ORAJEL) 10 % mucosal gel Use as directed 1 application in the mouth or  throat as needed for mouth pain., Starting Tue 03/02/2016, Print    ibuprofen (ADVIL,MOTRIN) 800 MG tablet Take 1 tablet (800 mg total) by mouth every 6 (six) hours as needed for moderate pain., Starting Tue 03/02/2016, Print         Lyndal Pulley, MD 03/02/16 [phone]

## 2016-07-19 ENCOUNTER — Encounter (HOSPITAL_COMMUNITY): Payer: Self-pay | Admitting: Emergency Medicine

## 2016-07-19 DIAGNOSIS — F17210 Nicotine dependence, cigarettes, uncomplicated: Secondary | ICD-10-CM | POA: Insufficient documentation

## 2016-07-19 DIAGNOSIS — I10 Essential (primary) hypertension: Secondary | ICD-10-CM | POA: Insufficient documentation

## 2016-07-19 DIAGNOSIS — R112 Nausea with vomiting, unspecified: Secondary | ICD-10-CM | POA: Insufficient documentation

## 2016-07-19 DIAGNOSIS — R197 Diarrhea, unspecified: Secondary | ICD-10-CM | POA: Insufficient documentation

## 2016-07-19 DIAGNOSIS — Z79899 Other long term (current) drug therapy: Secondary | ICD-10-CM | POA: Insufficient documentation

## 2016-07-19 LAB — CBC WITH DIFFERENTIAL/PLATELET
Basophils Absolute: 0 10*3/uL (ref 0.0–0.1)
Basophils Relative: 0 %
Eosinophils Absolute: 0 10*3/uL (ref 0.0–0.7)
Eosinophils Relative: 1 %
HCT: 42.1 % (ref 39.0–52.0)
Hemoglobin: 14.8 g/dL (ref 13.0–17.0)
Lymphocytes Relative: 27 %
Lymphs Abs: 1.5 10*3/uL (ref 0.7–4.0)
MCH: 31.7 pg (ref 26.0–34.0)
MCHC: 35.2 g/dL (ref 30.0–36.0)
MCV: 90.1 fL (ref 78.0–100.0)
Monocytes Absolute: 0.5 10*3/uL (ref 0.1–1.0)
Monocytes Relative: 9 %
Neutro Abs: 3.5 10*3/uL (ref 1.7–7.7)
Neutrophils Relative %: 63 %
Platelets: 232 10*3/uL (ref 150–400)
RBC: 4.67 MIL/uL (ref 4.22–5.81)
RDW: 13.3 % (ref 11.5–15.5)
WBC: 5.5 10*3/uL (ref 4.0–10.5)

## 2016-07-19 LAB — COMPREHENSIVE METABOLIC PANEL
ALT: 30 U/L (ref 17–63)
AST: 21 U/L (ref 15–41)
Albumin: 4.2 g/dL (ref 3.5–5.0)
Alkaline Phosphatase: 105 U/L (ref 38–126)
Anion gap: 8 (ref 5–15)
BUN: 9 mg/dL (ref 6–20)
CO2: 30 mmol/L (ref 22–32)
Calcium: 9.1 mg/dL (ref 8.9–10.3)
Chloride: 100 mmol/L — ABNORMAL LOW (ref 101–111)
Creatinine, Ser: 0.96 mg/dL (ref 0.61–1.24)
GFR calc Af Amer: >60 mL/min (ref >60)
GFR calc non Af Amer: >60 mL/min (ref >60)
Glucose, Bld: 91 mg/dL (ref 65–99)
Potassium: 3.4 mmol/L — ABNORMAL LOW (ref 3.5–5.1)
Sodium: 138 mmol/L (ref 135–145)
Total Bilirubin: 0.8 mg/dL (ref 0.3–1.2)
Total Protein: 7.6 g/dL (ref 6.5–8.1)

## 2016-07-19 LAB — URINALYSIS, ROUTINE W REFLEX MICROSCOPIC
Bilirubin Urine: NEGATIVE
Glucose, UA: NEGATIVE mg/dL
Hgb urine dipstick: NEGATIVE
Ketones, ur: NEGATIVE mg/dL
Nitrite: NEGATIVE
Protein, ur: NEGATIVE mg/dL
Specific Gravity, Urine: 1.014 (ref 1.005–1.030)
Squamous Epithelial / LPF: NONE SEEN
pH: 6 (ref 5.0–8.0)

## 2016-07-19 NOTE — ED Triage Notes (Signed)
Pt c/o nausea, vomiting and diarrhea. Onset this am  Pt c/o aching all over.  St's his daughter was seen last night for same

## 2016-07-20 ENCOUNTER — Emergency Department (HOSPITAL_COMMUNITY)
Admission: EM | Admit: 2016-07-20 | Discharge: 2016-07-20 | Disposition: A | Discharge status: Home / Self Care | Payer: Self-pay | Attending: Emergency Medicine | Admitting: Emergency Medicine

## 2016-07-20 ENCOUNTER — Encounter (HOSPITAL_COMMUNITY): Payer: Self-pay | Admitting: Emergency Medicine

## 2016-07-20 DIAGNOSIS — R112 Nausea with vomiting, unspecified: Secondary | ICD-10-CM

## 2016-07-20 DIAGNOSIS — R197 Diarrhea, unspecified: Secondary | ICD-10-CM

## 2016-07-20 MED ORDER — PROMETHAZINE HCL 25 MG PO TABS: 25.0000 mg | ORAL_TABLET | Freq: Four times a day (QID) | ORAL | 0 refills | Status: DC | PRN

## 2016-07-20 NOTE — ED Notes (Signed)
Pt sipped ginger ale and went back to sleep at this time.

## 2016-07-20 NOTE — ED Provider Notes (Signed)
[facility]PT Provider Note   CSN: [id number] Arrival date & time: 07/19/16  2223     History   Chief Complaint Chief Complaint  Patient presents with  . Emesis  . Diarrhea  . Generalized Body Aches    HPI Georgena SpurlingWillie G Brancato is a 26 y.o. male.  HPI   26 year old male presenting for evaluation of nausea vomiting diarrhea. Patient report for the past day and a half he has had generalized body aches, feeling nauseous, vomited multiple episodes of nonbloody non-bilious vomit, as well as having several bouts of loose stools. Symptom seems to be abating and he is currently feeling much better. He was at work earlier today and requests to go home but was not allowed so he decided to come to the ER instead. States that daughter has similar symptoms 2 days ago which has since improved. Denies any recent travel or eating exotic food. Currently denies having significant fever, chills, abdominal pain, dysuria, hematuria, or rash.  Past Medical History:  Diagnosis Date  . Hypertension     Patient Active Problem List   Diagnosis Date Noted  . Anxiety attack 04/16/2011  . Anxiety disorder 04/16/2011  . Weight loss, unintentional 04/16/2011  . Diarrhea in adult patient 04/16/2011    History reviewed. No pertinent surgical history.     Home Medications    Prior to Admission medications   Medication Sig Start Date End Date Taking? Authorizing Provider  benzocaine (ORAJEL) 10 % mucosal gel Use as directed 1 application in the mouth or throat as needed for mouth pain. Patient not taking: Reported on 07/20/2016 03/02/16   Lyndal PulleyKnott, Daniel, MD  ibuprofen (ADVIL,MOTRIN) 800 MG tablet Take 1 tablet (800 mg total) by mouth every 6 (six) hours as needed for moderate pain. Patient not taking: Reported on 07/20/2016 03/02/16   Lyndal PulleyKnott, Daniel, MD  LORazepam (ATIVAN) 1 MG tablet Take 1 tablet (1 mg total) by mouth 2 (two) times daily as needed for anxiety. Patient not taking: Reported on 07/20/2016 05/25/14    Elpidio AnisUpstill, Shari, PA-C    Family History No family history on file.  Social History Social History  Substance Use Topics  . Smoking status: Current Every Day Smoker    Packs/day: 1.00    Types: Cigarettes  . Smokeless tobacco: Never Used  . Alcohol use Yes     Comment: occ.     Allergies   Patient has no known allergies.   Review of Systems Review of Systems  All other systems reviewed and are negative.    Physical Exam Updated Vital Signs BP 138/90 (BP Location: Right Arm)   Pulse 97   Temp 98.1 F (36.7 C) (Oral)   Resp 17   Ht 6' (1.829 m)   Wt 69.4 kg (153 lb)   SpO2 100%   BMI 20.75 kg/m   Physical Exam  Constitutional: He appears well-developed and well-nourished. No distress.  HENT:  Head: Atraumatic.  Mouth/Throat: Oropharynx is clear and moist.  Eyes: Conjunctivae are normal.  Neck: Neck supple.  Cardiovascular: Normal rate and regular rhythm.   Pulmonary/Chest: Effort normal and breath sounds normal. He has no wheezes. He has no rales.  Abdominal: Soft. Bowel sounds are normal. He exhibits no distension. There is no tenderness.  Abdomen is soft and nontender. Negative Murphy sign, no pain at McBurney's point.  Neurological: He is alert.  Skin: No rash noted.  Psychiatric: He has a normal mood and affect.  Nursing note and vitals reviewed.  ED Treatments / Results  Labs (all labs ordered are listed, but only abnormal results are displayed) Labs Reviewed  URINALYSIS, ROUTINE W REFLEX MICROSCOPIC - Abnormal; Notable for the following:       Result Value   Leukocytes, UA SMALL (*)    Bacteria, UA RARE (*)    All other components within normal limits  COMPREHENSIVE METABOLIC PANEL - Abnormal; Notable for the following:    Potassium 3.4 (*)    Chloride 100 (*)    All other components within normal limits  CBC WITH DIFFERENTIAL/PLATELET    EKG  EKG Interpretation None       Radiology No results found.  Procedures Procedures  (including critical care time)  Medications Ordered in ED Medications - No data to display   Initial Impression / Assessment and Plan / ED Course  I have reviewed the triage vital signs and the nursing notes.  Pertinent labs & imaging results that were available during my care of the patient were reviewed by me and considered in my medical decision making (see chart for details).     BP 138/90 (BP Location: Right Arm)   Pulse 97   Temp 98.1 F (36.7 C) (Oral)   Resp 17   Ht 6' (1.829 m)   Wt 69.4 kg (153 lb)   SpO2 100%   BMI 20.75 kg/m    Final Clinical Impressions(s) / ED Diagnoses   Final diagnoses:  Nausea vomiting and diarrhea    New Prescriptions New Prescriptions   PROMETHAZINE (PHENERGAN) 25 MG TABLET    Take 1 tablet (25 mg total) by mouth every 6 (six) hours as needed for nausea.   2:35 AM Patient with nausea vomiting diarrhea and generalized myalgias. Symptoms suggestive of a viral GI. Daughter with similar symptoms which has since improved. He is well-appearing, does not appear to be dehydrated, abdomen is soft and nontender therefore I have low suspicion for any acute abdominal pathology. His labs are reassuring. Patient tolerates by mouth. Stable for discharge. Return precaution discussed.   Fayrene Helper, PA-C 07/20/16 1610    Zadie Rhine, MD 07/21/16 [phone]

## 2016-07-20 NOTE — ED Notes (Signed)
Pt given ginger ale.

## 2016-07-20 NOTE — ED Notes (Addendum)
Pt brought in his daughter with the same symptoms on Sunday.  Today developed N/V/D.  Was at work and had to leave early tonight d/t feeling unwell. Last episode of diarrhea was about 1930, last vomiting episode was 1630.  Only complaint at this time is generalized body aches.

## 2017-01-21 ENCOUNTER — Encounter (HOSPITAL_COMMUNITY): Payer: Self-pay

## 2017-01-21 ENCOUNTER — Emergency Department (HOSPITAL_COMMUNITY)
Admission: EM | Admit: 2017-01-21 | Discharge: 2017-01-21 | Disposition: A | Discharge status: Home / Self Care | Payer: Self-pay | Attending: Emergency Medicine | Admitting: Emergency Medicine

## 2017-01-21 DIAGNOSIS — Z79899 Other long term (current) drug therapy: Secondary | ICD-10-CM | POA: Insufficient documentation

## 2017-01-21 DIAGNOSIS — I10 Essential (primary) hypertension: Secondary | ICD-10-CM | POA: Insufficient documentation

## 2017-01-21 DIAGNOSIS — S41131A Puncture wound without foreign body of right upper arm, initial encounter: Secondary | ICD-10-CM | POA: Insufficient documentation

## 2017-01-21 DIAGNOSIS — Z23 Encounter for immunization: Secondary | ICD-10-CM | POA: Insufficient documentation

## 2017-01-21 DIAGNOSIS — Y929 Unspecified place or not applicable: Secondary | ICD-10-CM | POA: Insufficient documentation

## 2017-01-21 DIAGNOSIS — Y939 Activity, unspecified: Secondary | ICD-10-CM | POA: Insufficient documentation

## 2017-01-21 DIAGNOSIS — F17210 Nicotine dependence, cigarettes, uncomplicated: Secondary | ICD-10-CM | POA: Insufficient documentation

## 2017-01-21 DIAGNOSIS — S41111A Laceration without foreign body of right upper arm, initial encounter: Secondary | ICD-10-CM

## 2017-01-21 DIAGNOSIS — Y998 Other external cause status: Secondary | ICD-10-CM | POA: Insufficient documentation

## 2017-01-21 MED ORDER — CEPHALEXIN 500 MG PO CAPS: 500.0000 mg | ORAL_CAPSULE | Freq: Two times a day (BID) | ORAL | 0 refills | Status: AC

## 2017-01-21 MED ORDER — TETANUS-DIPHTH-ACELL PERTUSSIS 5-2.5-18.5 LF-MCG/0.5 IM SUSP
0.5000 mL | Freq: Once | INTRAMUSCULAR | Status: AC
  Administered 2017-01-21: 0.5 mL via INTRAMUSCULAR
  Filled 2017-01-21: qty 0.5
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
## 2018-02-11 ENCOUNTER — Emergency Department (HOSPITAL_COMMUNITY): Payer: Self-pay

## 2018-02-11 ENCOUNTER — Other Ambulatory Visit: Payer: Self-pay

## 2018-02-11 ENCOUNTER — Emergency Department (HOSPITAL_COMMUNITY)
Admission: EM | Admit: 2018-02-11 | Discharge: 2018-02-11 | Disposition: A | Discharge status: Home / Self Care | Payer: Self-pay | Attending: Emergency Medicine | Admitting: Emergency Medicine

## 2018-02-11 DIAGNOSIS — I10 Essential (primary) hypertension: Secondary | ICD-10-CM | POA: Insufficient documentation

## 2018-02-11 DIAGNOSIS — M25512 Pain in left shoulder: Secondary | ICD-10-CM | POA: Insufficient documentation

## 2018-02-11 DIAGNOSIS — F17210 Nicotine dependence, cigarettes, uncomplicated: Secondary | ICD-10-CM | POA: Insufficient documentation

## 2018-02-11 DIAGNOSIS — T148XXA Other injury of unspecified body region, initial encounter: Secondary | ICD-10-CM

## 2018-02-11 MED ORDER — MELOXICAM 7.5 MG PO TABS: 7.5000 mg | ORAL_TABLET | Freq: Every day | ORAL | 0 refills | Status: DC

## 2018-02-11 MED ORDER — KETOROLAC TROMETHAMINE 60 MG/2ML IM SOLN
60.0000 mg | Freq: Once | INTRAMUSCULAR | Status: AC
  Administered 2018-02-11: 60 mg via INTRAMUSCULAR
  Filled 2018-02-11: qty 2

## 2018-02-11 MED ORDER — DIAZEPAM 5 MG PO TABS: 5.0000 mg | ORAL_TABLET | Freq: Four times a day (QID) | ORAL | 0 refills | Status: DC | PRN

## 2018-02-11 MED ORDER — DIAZEPAM 5 MG PO TABS
5.0000 mg | ORAL_TABLET | Freq: Once | ORAL | Status: AC
  Administered 2018-02-11: 5 mg via ORAL
  Filled 2018-02-11: qty 1

## 2018-02-11 NOTE — ED Provider Notes (Signed)
Emergency Department Provider Note   I have reviewed the triage vital signs and the nursing notes.   HISTORY  Chief Complaint Shoulder Pain   HPI Danny Bishop is a 27 y.o. male who presents the emerge department today with a left shoulder pain after wrestling around with some of his friends a couple days ago.  Patient states that it used to be his neck to when he can turn his neck but now that is improved.  He still has significant left shoulder pain whenever he tries range of motion exercises.  Patient states that heat seems to make a little bit better but it gets bad again especially in the morning.  No other injuries.  No other associated symptoms.  No numbness or weakness. No other associated or modifying symptoms.    Past Medical History:  Diagnosis Date  . Hypertension     Patient Active Problem List   Diagnosis Date Noted  . Anxiety attack 04/16/2011  . Anxiety disorder 04/16/2011  . Weight loss, unintentional 04/16/2011  . Diarrhea in adult patient 04/16/2011    No past surgical history on file.  Current Outpatient Rx  . Order #: [id number] Class: Print  . Order #: [id number] Class: Print  . Order #: [id number] Class: Print    Allergies Patient has no known allergies.  No family history on file.  Social History Social History   Tobacco Use  . Smoking status: Current Every Day Smoker    Packs/day: 1.00    Types: Cigarettes  . Smokeless tobacco: Never Used  Substance Use Topics  . Alcohol use: Yes    Comment: occ.  . Drug use: Yes    Types: Marijuana    Comment: 2 days ago    Review of Systems  All other systems negative except as documented in the HPI. All pertinent positives and negatives as reviewed in the HPI. ____________________________________________   PHYSICAL EXAM:  VITAL SIGNS: ED Triage Vitals  Enc Vitals Group     BP 02/11/18 1247 (!) 138/91     Pulse Rate 02/11/18 1247 89     Resp 02/11/18 1247 16     Temp --      Temp src  --      SpO2 02/11/18 1247 99 %    Constitutional: Alert and oriented. Well appearing and in no acute distress. Eyes: Conjunctivae are normal. PERRL. EOMI. Head: Atraumatic. Nose: No congestion/rhinnorhea. Mouth/Throat: Mucous membranes are moist.  Oropharynx non-erythematous. Neck: No stridor.  No meningeal signs.   Cardiovascular: Normal rate, regular rhythm. Good peripheral circulation. Grossly normal heart sounds.   Respiratory: Normal respiratory effort.  No retractions. Lungs CTAB. Gastrointestinal: Soft and nontender. No distention.  Musculoskeletal: tenderness directly over trapezius muscle and insertion sites. Symmetric strength, sensation and reflexes.  Neurologic:  Normal speech and language. No gross focal neurologic deficits are appreciated.  Skin:  Skin is warm, dry and intact. No rash noted.   ____________________________________________   RADIOLOGY  Dg Shoulder Left  Result Date: 02/11/2018 CLINICAL DATA:  Left shoulder pain and decreased range of motion. EXAM: LEFT SHOULDER - 2+ VIEW COMPARISON:  None. FINDINGS: There is no evidence of fracture or dislocation. There is no evidence of arthropathy or other focal bone abnormality. Soft tissues are unremarkable. IMPRESSION: Negative. Electronically Signed   By: Francene BoyersJames  Maxwell M.D.   On: 02/11/2018 12:36    ____________________________________________   INITIAL IMPRESSION / ASSESSMENT AND PLAN / ED COURSE  NVI. Suspect shoulder strain. otherwise stable for dc w/  symptomatic treatment.   Pertinent labs & imaging results that were available during my care of the patient were reviewed by me and considered in my medical decision making (see chart for details).  ____________________________________________  FINAL CLINICAL IMPRESSION(S) / ED DIAGNOSES  Final diagnoses:  Muscle strain     MEDICATIONS GIVEN DURING THIS VISIT:  Medications  diazepam (VALIUM) tablet 5 mg (5 mg Oral Given 02/11/18 1228)  ketorolac  (TORADOL) injection 60 mg (60 mg Intramuscular Given 02/11/18 1229)     NEW OUTPATIENT MEDICATIONS STARTED DURING THIS VISIT:  New Prescriptions   DIAZEPAM (VALIUM) 5 MG TABLET    Take 1 tablet (5 mg total) by mouth every 6 (six) hours as needed (spasms).   MELOXICAM (MOBIC) 7.5 MG TABLET    Take 1 tablet (7.5 mg total) by mouth daily.    Note:  This note was prepared with assistance of Dragon voice recognition software. Occasional wrong-word or sound-a-like substitutions may have occurred due to the inherent limitations of voice recognition software.   Marily MemosMesner, Romonia Yanik, MD 02/11/18 [phone]

## 2018-02-11 NOTE — ED Notes (Signed)
Patient verbalizes understanding of discharge instructions. Opportunity for questioning and answers were provided. Armband removed by staff, pt discharged from ED.  

## 2018-02-11 NOTE — ED Notes (Signed)
Patient transported to X-ray 

## 2018-02-11 NOTE — ED Triage Notes (Signed)
Pt endorses left shoulder/neck pain x 3 days after he was drinking with some friends and fell while "messing around" Ambulatory.

## 2019-07-21 ENCOUNTER — Emergency Department (HOSPITAL_COMMUNITY)
Admission: EM | Admit: 2019-07-21 | Discharge: 2019-07-21 | Disposition: A | Discharge status: Home / Self Care | Payer: Self-pay | Attending: Emergency Medicine | Admitting: Emergency Medicine

## 2019-07-21 ENCOUNTER — Encounter (HOSPITAL_COMMUNITY): Payer: Self-pay

## 2019-07-21 ENCOUNTER — Other Ambulatory Visit: Payer: Self-pay

## 2019-07-21 DIAGNOSIS — R300 Dysuria: Secondary | ICD-10-CM | POA: Insufficient documentation

## 2019-07-21 DIAGNOSIS — R369 Urethral discharge, unspecified: Secondary | ICD-10-CM | POA: Insufficient documentation

## 2019-07-21 DIAGNOSIS — I10 Essential (primary) hypertension: Secondary | ICD-10-CM | POA: Insufficient documentation

## 2019-07-21 DIAGNOSIS — Z202 Contact with and (suspected) exposure to infections with a predominantly sexual mode of transmission: Secondary | ICD-10-CM | POA: Insufficient documentation

## 2019-07-21 DIAGNOSIS — F17210 Nicotine dependence, cigarettes, uncomplicated: Secondary | ICD-10-CM | POA: Insufficient documentation

## 2019-07-21 LAB — URINALYSIS, ROUTINE W REFLEX MICROSCOPIC
Bacteria, UA: NONE SEEN
Bilirubin Urine: NEGATIVE
Glucose, UA: NEGATIVE mg/dL
Hgb urine dipstick: NEGATIVE
Ketones, ur: NEGATIVE mg/dL
Leukocytes,Ua: NEGATIVE
Nitrite: NEGATIVE
Protein, ur: 30 mg/dL — AB
Specific Gravity, Urine: 1.011 (ref 1.005–1.030)
pH: 7 (ref 5.0–8.0)

## 2019-07-21 MED ORDER — LIDOCAINE HCL (PF) 1 % IJ SOLN
INTRAMUSCULAR | Status: AC
  Administered 2019-07-21: 5 mL
  Filled 2019-07-21: qty 5

## 2019-07-21 MED ORDER — CEFTRIAXONE SODIUM 500 MG IJ SOLR
500.0000 mg | Freq: Once | INTRAMUSCULAR | Status: AC
  Administered 2019-07-21: 500 mg via INTRAMUSCULAR
  Filled 2019-07-21: qty 500

## 2019-07-21 MED ORDER — AZITHROMYCIN 250 MG PO TABS
1000.0000 mg | ORAL_TABLET | Freq: Once | ORAL | Status: AC
  Administered 2019-07-21: 1000 mg via ORAL
  Filled 2019-07-21: qty 4

## 2019-07-21 NOTE — ED Triage Notes (Signed)
Patient complains of penile/groin tingling for several days, seen at [facility] department with negative test, NAD

## 2019-07-21 NOTE — Discharge Instructions (Signed)
Please read and follow all provided instructions.  Your diagnoses today include:  1. Dysuria     Tests performed today include:  Test for gonorrhea and chlamydia.   Vital signs. See below for your results today.   Medications:  For treatment of gonorrhea/chlamydia: You were treated with a rocephin (shot) today and azithromycin (pills).   Home care instructions:  Read educational materials contained in this packet and follow any instructions provided.   You should tell your partners about your infection and avoid having sex for one week to allow time for the medicine to work.  Sexually transmitted disease testing also available at:   [facility] [place], [facility]  [address], [place], phone [phone] or [phone]    Monday - Friday, call for an appointment  Return instructions:   Please return to the Emergency Department if you experience worsening symptoms.   Please return if you have any other emergent concerns.  Additional Information:  Your vital signs today were: BP (!) 158/101 (BP Location: Right Arm)   Pulse 98   Temp 98.5 F (36.9 C) (Oral)   Resp 20   SpO2 98%  If your blood pressure (BP) was elevated above 135/85 this visit, please have this repeated by your doctor within one month. --------------

## 2019-07-21 NOTE — ED Provider Notes (Signed)
[facility] EMERGENCY DEPARTMENT Provider Note   CSN: [id number] Arrival date & time: 07/21/19  8527     History No chief complaint on file.   Danny Bishop is a 29 y.o. male.  Patient is a 29 year old male who presents to the emergency department today for discomfort with urination and urethral discharge.  Patient states that he had routine STD testing at the health department last week.  He was not having symptoms at this time.  He reports that his testing was negative.  In the past 3 to 4 days he has noted a "milky" discharge at the end of his stream.  He has some tingling at the end of his penis after urination.  No other skin rashes or skin lesions.  He is sexually active with one male partner.  No treatments prior to arrival.        Past Medical History:  Diagnosis Date  . Hypertension     Patient Active Problem List   Diagnosis Date Noted  . Anxiety attack 04/16/2011  . Anxiety disorder 04/16/2011  . Weight loss, unintentional 04/16/2011  . Diarrhea in adult patient 04/16/2011    History reviewed. No pertinent surgical history.     No family history on file.  Social History   Tobacco Use  . Smoking status: Current Every Day Smoker    Packs/day: 1.00    Types: Cigarettes  . Smokeless tobacco: Never Used  Substance Use Topics  . Alcohol use: Yes    Comment: occ.  . Drug use: Yes    Types: Marijuana    Comment: 2 days ago    Home Medications Prior to Admission medications   Medication Sig Start Date End Date Taking? Authorizing Provider  diazepam (VALIUM) 5 MG tablet Take 1 tablet (5 mg total) by mouth every 6 (six) hours as needed (spasms). 02/11/18   Mesner, Barbara Cower, MD  meloxicam (MOBIC) 7.5 MG tablet Take 1 tablet (7.5 mg total) by mouth daily. 02/11/18   Mesner, Barbara Cower, MD  promethazine (PHENERGAN) 25 MG tablet Take 1 tablet (25 mg total) by mouth every 6 (six) hours as needed for nausea. 07/20/16   Fayrene Helper, PA-C    Allergies     Patient has no known allergies.  Review of Systems   Review of Systems  Constitutional: Negative for fever.  HENT: Negative for sore throat.   Eyes: Negative for discharge.  Gastrointestinal: Negative for rectal pain.  Genitourinary: Positive for discharge and dysuria. Negative for frequency, genital sores, penile pain and testicular pain.  Musculoskeletal: Negative for arthralgias.  Skin: Negative for rash.  Hematological: Negative for adenopathy.    Physical Exam Updated Vital Signs BP (!) 158/101 (BP Location: Right Arm)   Pulse 98   Temp 98.5 F (36.9 C) (Oral)   Resp 20   SpO2 98%   Physical Exam Vitals and nursing note reviewed.  Constitutional:      Appearance: He is well-developed.  HENT:     Head: Normocephalic and atraumatic.  Eyes:     Conjunctiva/sclera: Conjunctivae normal.  Pulmonary:     Effort: No respiratory distress.  Genitourinary:    Penis: Discharge (scant, white) present. No swelling or lesions.      Testes: Normal.        Right: Tenderness or swelling not present.        Left: Tenderness or swelling not present.  Musculoskeletal:     Cervical back: Normal range of motion and neck supple.  Skin:  General: Skin is warm and dry.  Neurological:     Mental Status: He is alert.     ED Results / Procedures / Treatments   Labs (all labs ordered are listed, but only abnormal results are displayed) Labs Reviewed  URINALYSIS, ROUTINE W REFLEX MICROSCOPIC - Abnormal; Notable for the following components:      Result Value   Protein, ur 30 (*)    All other components within normal limits  GC/CHLAMYDIA PROBE AMP () NOT AT [facility]    EKG None  Radiology No results found.  Procedures Procedures (including critical care time)  Medications Ordered in ED Medications  cefTRIAXone (ROCEPHIN) injection 500 mg (has no administration in time range)  lidocaine (PF) (XYLOCAINE) 1 % injection (has no administration in time range)    azithromycin (ZITHROMAX) tablet 1,000 mg (1,000 mg Oral Given 07/21/19 1235)    ED Course  I have reviewed the triage vital signs and the nursing notes.  Pertinent labs & imaging results that were available during my care of the patient were reviewed by me and considered in my medical decision making (see chart for details).  Patient seen and examined.  UA without signs of infection.  Clinically, patient has mild signs of urethritis.  Offered treatment today versus awaiting results given recent negative STI evaluation.  Patient elects for treatment today.  Rocephin/azithromycin ordered.  Vital signs reviewed and are as follows: BP (!) 158/101 (BP Location: Right Arm)   Pulse 98   Temp 98.5 F (36.9 C) (Oral)   Resp 20   SpO2 98%   Patient counseled on safe sexual practices. Told them that they should not have sexual contact for next 7 days and that they need to inform sexual partners so that they can get tested and treated as well. Patient verbalizes understanding and agrees with plan.      MDM Rules/Calculators/A&P                       Patient with urethral discharge and dysuria, concerning for urethritis.  He did have a negative STI work-up.  No other skin findings.  If patient is not improved with treatment, recommend that he follow-up with PCP.  Discussed urologist follow-up, however patient states that he is not going to go to urologist due to previous experiences with them.     Final Clinical Impression(s) / ED Diagnoses Final diagnoses:  None    Rx / DC Orders ED Discharge Orders    None       Suann Larry 07/21/19 1237    Carmin Muskrat, MD 07/22/19 2103

## 2019-07-23 LAB — GC/CHLAMYDIA PROBE AMP (~~LOC~~) NOT AT ARMC
Chlamydia: NEGATIVE
Comment: NEGATIVE
Comment: NORMAL
Neisseria Gonorrhea: NEGATIVE

## 2019-10-17 ENCOUNTER — Encounter (HOSPITAL_COMMUNITY): Payer: Self-pay

## 2019-10-17 ENCOUNTER — Other Ambulatory Visit: Payer: Self-pay

## 2019-10-17 ENCOUNTER — Emergency Department (HOSPITAL_COMMUNITY)
Admission: EM | Admit: 2019-10-17 | Discharge: 2019-10-18 | Disposition: A | Discharge status: Home / Self Care | Payer: Self-pay | Attending: Emergency Medicine | Admitting: Emergency Medicine

## 2019-10-17 DIAGNOSIS — Z5321 Procedure and treatment not carried out due to patient leaving prior to being seen by health care provider: Secondary | ICD-10-CM | POA: Insufficient documentation

## 2019-10-17 DIAGNOSIS — R42 Dizziness and giddiness: Secondary | ICD-10-CM | POA: Insufficient documentation

## 2019-10-17 LAB — CBC
HCT: 43.9 % (ref 39.0–52.0)
Hemoglobin: 15.2 g/dL (ref 13.0–17.0)
MCH: 31.8 pg (ref 26.0–34.0)
MCHC: 34.6 g/dL (ref 30.0–36.0)
MCV: 91.8 fL (ref 80.0–100.0)
Platelets: 279 10*3/uL (ref 150–400)
RBC: 4.78 MIL/uL (ref 4.22–5.81)
RDW: 13.2 % (ref 11.5–15.5)
WBC: 6.2 10*3/uL (ref 4.0–10.5)
nRBC: 0 % (ref 0.0–0.2)

## 2019-10-17 LAB — BASIC METABOLIC PANEL
Anion gap: 11 (ref 5–15)
BUN: 8 mg/dL (ref 6–20)
CO2: 26 mmol/L (ref 22–32)
Calcium: 9.4 mg/dL (ref 8.9–10.3)
Chloride: 105 mmol/L (ref 98–111)
Creatinine, Ser: 0.75 mg/dL (ref 0.61–1.24)
GFR calc Af Amer: >60 mL/min (ref >60)
GFR calc non Af Amer: >60 mL/min (ref >60)
Glucose, Bld: 93 mg/dL (ref 70–99)
Potassium: 3.6 mmol/L (ref 3.5–5.1)
Sodium: 142 mmol/L (ref 135–145)

## 2019-10-17 LAB — URINALYSIS, ROUTINE W REFLEX MICROSCOPIC
Bacteria, UA: NONE SEEN
Bilirubin Urine: NEGATIVE
Glucose, UA: NEGATIVE mg/dL
Hgb urine dipstick: NEGATIVE
Ketones, ur: NEGATIVE mg/dL
Leukocytes,Ua: NEGATIVE
Nitrite: NEGATIVE
Protein, ur: 30 mg/dL — AB
Specific Gravity, Urine: 1.011 (ref 1.005–1.030)
pH: 7 (ref 5.0–8.0)

## 2019-10-17 LAB — TROPONIN I (HIGH SENSITIVITY): Troponin I (High Sensitivity): 3 ng/L (ref <18)

## 2019-10-17 NOTE — ED Triage Notes (Signed)
Reports hx "panic attacks" report feeling tonight very similiar

## 2019-10-17 NOTE — ED Triage Notes (Signed)
Pt presents via POV c/o dizziness while at work. Denies syncope. Reports chest type feeling but denies pain. Denies medical problems.

## 2019-10-18 LAB — TROPONIN I (HIGH SENSITIVITY): Troponin I (High Sensitivity): 2 ng/L (ref <18)

## 2019-10-18 NOTE — ED Notes (Signed)
Pt LWBS 

## 2020-01-23 ENCOUNTER — Other Ambulatory Visit: Payer: Self-pay

## 2020-01-23 ENCOUNTER — Ambulatory Visit (HOSPITAL_COMMUNITY)
Admission: EM | Admit: 2020-01-23 | Discharge: 2020-01-23 | Disposition: A | Discharge status: Home / Self Care | Payer: BC Managed Care – PPO | Attending: Internal Medicine | Admitting: Internal Medicine

## 2020-01-23 ENCOUNTER — Encounter (HOSPITAL_COMMUNITY): Payer: Self-pay | Admitting: Emergency Medicine

## 2020-01-23 DIAGNOSIS — R300 Dysuria: Secondary | ICD-10-CM | POA: Diagnosis not present

## 2020-01-23 DIAGNOSIS — R109 Unspecified abdominal pain: Secondary | ICD-10-CM | POA: Diagnosis present

## 2020-01-23 LAB — POCT URINALYSIS DIPSTICK, ED / UC
Bilirubin Urine: NEGATIVE
Glucose, UA: NEGATIVE mg/dL
Ketones, ur: NEGATIVE mg/dL
Leukocytes,Ua: NEGATIVE
Nitrite: NEGATIVE
Protein, ur: 30 mg/dL — AB
Specific Gravity, Urine: 1.02 (ref 1.005–1.030)
Urobilinogen, UA: 0.2 mg/dL (ref 0.0–1.0)
pH: 7 (ref 5.0–8.0)

## 2020-01-23 MED ORDER — KETOROLAC TROMETHAMINE 60 MG/2ML IM SOLN
60.0000 mg | Freq: Once | INTRAMUSCULAR | Status: AC
  Administered 2020-01-23: 60 mg via INTRAMUSCULAR

## 2020-01-23 MED ORDER — TAMSULOSIN HCL 0.4 MG PO CAPS: 0.4000 mg | ORAL_CAPSULE | Freq: Every day | ORAL | 0 refills | Status: AC

## 2020-01-23 MED ORDER — KETOROLAC TROMETHAMINE 60 MG/2ML IM SOLN
INTRAMUSCULAR | Status: AC
  Filled 2020-01-23: qty 2

## 2020-01-23 MED ORDER — IBUPROFEN 800 MG PO TABS: 800.0000 mg | ORAL_TABLET | Freq: Three times a day (TID) | ORAL | 0 refills | Status: DC | PRN

## 2020-01-23 NOTE — Discharge Instructions (Signed)
I am concerned he may have a kidney stone that is causing your pain.  I have given you anti-inflammatory medication in the office today.  I have prescribed another anti-inflammatory medication.  Do not begin taking until tomorrow.  Take Flomax daily x2 weeks.  I have given you the number to [facility] for follow-up.  You will need to call them to make an appointment.  You will need to go to the ER for any worsening abdominal pain, fever or chills

## 2020-01-23 NOTE — ED Provider Notes (Addendum)
[facility]    CSN: [id number] Arrival date & time: 01/23/20  1221      History   Chief Complaint No chief complaint on file.   HPI Danny Bishop is a 29 y.o. male with history of hypertension presents today with complaints of low back pain and painful urination.  Patient reports sudden onset of low back pain last evening with 2 episodes of urinary incontinence.  Patient describes mild pain with urination and difficulty starting stream.  He denies any gross hematuria, no recent fever or chills, no recent fall or injury.  Pain unchanged with movement.  Patient also describes drainage from penis x3 days.  Unable to describe penile discharge.  Patient denies history of kidney stones.   Past Medical History:  Diagnosis Date  . Hypertension     Patient Active Problem List   Diagnosis Date Noted  . Anxiety attack 04/16/2011  . Anxiety disorder 04/16/2011  . Weight loss, unintentional 04/16/2011  . Diarrhea in adult patient 04/16/2011    History reviewed. No pertinent surgical history.     Home Medications    Prior to Admission medications   Medication Sig Start Date End Date Taking? Authorizing Provider  diazepam (VALIUM) 5 MG tablet Take 1 tablet (5 mg total) by mouth every 6 (six) hours as needed (spasms). 02/11/18   Mesner, Barbara Cower, MD  ibuprofen (ADVIL) 800 MG tablet Take 1 tablet (800 mg total) by mouth every 8 (eight) hours as needed. 01/23/20   Rolla Etienne, NP  meloxicam (MOBIC) 7.5 MG tablet Take 1 tablet (7.5 mg total) by mouth daily. 02/11/18   Mesner, Barbara Cower, MD  promethazine (PHENERGAN) 25 MG tablet Take 1 tablet (25 mg total) by mouth every 6 (six) hours as needed for nausea. 07/20/16   Fayrene Helper, PA-C  tamsulosin (FLOMAX) 0.4 MG CAPS capsule Take 1 capsule (0.4 mg total) by mouth daily for 14 days. 01/23/20 02/06/20  Rolla Etienne, NP    Family History History reviewed. No pertinent family history.  Social History Social History   Tobacco Use   . Smoking status: Current Every Day Smoker    Packs/day: 1.00    Types: Cigarettes  . Smokeless tobacco: Never Used  Substance Use Topics  . Alcohol use: Yes    Comment: occ.  . Drug use: Yes    Types: Marijuana    Comment: 2 days ago     Allergies   Patient has no known allergies.   Review of Systems As stated in HPI otherwise negative   Physical Exam Triage Vital Signs ED Triage Vitals  Enc Vitals Group     BP 01/23/20 1259 (!) 162/104     Pulse Rate 01/23/20 1259 90     Resp 01/23/20 1259 16     Temp 01/23/20 1259 98.7 F (37.1 C)     Temp Source 01/23/20 1259 Oral     SpO2 01/23/20 1259 100 %     Weight --      Height --      Head Circumference --      Peak Flow --      Pain Score 01/23/20 1258 8     Pain Loc --      Pain Edu? --      Excl. in GC? --    No data found.  Updated Vital Signs BP (!) 162/104 (BP Location: Left Arm)   Pulse 90   Temp 98.7 F (37.1 C) (Oral)   Resp 16  SpO2 100%      Physical Exam Constitutional:      Appearance: Normal appearance.     Comments: Appearing uncomfortable but in no acute distress  Abdominal:     General: Bowel sounds are normal. There is no distension.     Palpations: Abdomen is soft.     Tenderness: There is no abdominal tenderness. There is right CVA tenderness. There is no guarding.  Genitourinary:    Penis: Normal.      Testes: Normal.     Comments: No drainage noted Musculoskeletal:        General: No swelling or tenderness. Normal range of motion.  Skin:    General: Skin is warm and dry.  Neurological:     General: No focal deficit present.     Mental Status: He is alert and oriented to person, place, and time.  Psychiatric:        Mood and Affect: Mood normal.        Behavior: Behavior normal.      UC Treatments / Results  Labs (all labs ordered are listed, but only abnormal results are displayed) Labs Reviewed  POCT URINALYSIS DIPSTICK, ED / UC - Abnormal; Notable for the  following components:      Result Value   Hgb urine dipstick TRACE (*)    Protein, ur 30 (*)    All other components within normal limits    EKG   Radiology No results found.  Procedures Procedures (including critical care time)  Medications Ordered in UC Medications  ketorolac (TORADOL) injection 60 mg (60 mg Intramuscular Given 01/23/20 1341)    Initial Impression / Assessment and Plan / UC Course  I have reviewed the triage vital signs and the nursing notes.  Pertinent labs & imaging results that were available during my care of the patient were reviewed by me and considered in my medical decision making (see chart for details).  Flank pain, right Dysuria  -VSS and patient is nontoxic-appearing -Concern for kidney stone based on history and exam.  Less likely MSK or infectious etiology -Trace hemoglobin in urine.  No evidence of UTI. -Toradol IM in office.  NSAIDs as needed.  Flomax daily x2 weeks. -STI screening  -Referral to [facility] for follow-up -Patient to go to ER for worsening pain, fever, N/V  Reviewed expections re: course of current medical issues. Questions answered. Outlined signs and symptoms indicating need for more acute intervention. Pt verbalized understanding. AVS given   Final Clinical Impressions(s) / UC Diagnoses   Final diagnoses:  Acute right flank pain  Dysuria     Discharge Instructions     I am concerned he may have a kidney stone that is causing your pain.  I have given you anti-inflammatory medication in the office today.  I have prescribed another anti-inflammatory medication.  Do not begin taking until tomorrow.  Take Flomax daily x2 weeks.  I have given you the number to [facility] for follow-up.  You will need to call them to make an appointment.  You will need to go to the ER for any worsening abdominal pain, fever or chills    ED Prescriptions    Medication Sig Dispense Auth. Provider   ibuprofen (ADVIL) 800  MG tablet Take 1 tablet (800 mg total) by mouth every 8 (eight) hours as needed. 30 tablet Rolla Etienne, NP   tamsulosin (FLOMAX) 0.4 MG CAPS capsule Take 1 capsule (0.4 mg total) by mouth daily for 14 days.  14 capsule Rolla Etienne, NP     PDMP not reviewed this encounter.   Rolla Etienne, NP 01/23/20 1355    Rolla Etienne, NP 01/23/20 1357

## 2020-01-23 NOTE — ED Triage Notes (Signed)
Pt presents with low back pain, urinary urgency xs 3 days. States urinated on self twice yesterday. States has to strain to urinate.

## 2020-01-24 LAB — CYTOLOGY, (ORAL, ANAL, URETHRAL) ANCILLARY ONLY
Chlamydia: NEGATIVE
Comment: NEGATIVE
Comment: NEGATIVE
Comment: NORMAL
Neisseria Gonorrhea: NEGATIVE
Trichomonas: NEGATIVE

## 2020-09-19 ENCOUNTER — Ambulatory Visit (HOSPITAL_COMMUNITY)
Admission: EM | Admit: 2020-09-19 | Discharge: 2020-09-19 | Disposition: A | Discharge status: Home / Self Care | Payer: Managed Care, Other (non HMO) | Attending: Emergency Medicine | Admitting: Emergency Medicine

## 2020-09-19 ENCOUNTER — Encounter (HOSPITAL_COMMUNITY): Payer: Self-pay | Admitting: *Deleted

## 2020-09-19 ENCOUNTER — Other Ambulatory Visit: Payer: Self-pay

## 2020-09-19 DIAGNOSIS — Z113 Encounter for screening for infections with a predominantly sexual mode of transmission: Secondary | ICD-10-CM

## 2020-09-19 DIAGNOSIS — Z202 Contact with and (suspected) exposure to infections with a predominantly sexual mode of transmission: Secondary | ICD-10-CM | POA: Diagnosis present

## 2020-09-19 MED ORDER — METRONIDAZOLE 500 MG PO TABS: 500.0000 mg | ORAL_TABLET | Freq: Two times a day (BID) | ORAL | 0 refills | Status: AC

## 2020-09-19 NOTE — ED Triage Notes (Signed)
Pt notified by partner was tested

## 2020-09-19 NOTE — Discharge Instructions (Addendum)
Take the Flagyl twice a day for the next 7 days.  Do not drink alcohol while taking this medication as it can make you sick.   We will contact you if the results from your lab work are positive and require additional treatment.    Do not have sex while taking undergoing treatment for STI.  Make sure that all of your partners get tested and treated.   Use a condom or other barrier method for all sexual encounters.    Return or go to the Emergency Department if symptoms worsen or do not improve in the next few days.  

## 2020-09-19 NOTE — ED Provider Notes (Signed)
[facility]    CSN: [id number] Arrival date & time: 09/19/20  1756      History   Chief Complaint Chief Complaint  Patient presents with   Exposure to STD    HPI Danny Bishop is a 30 y.o. male.   Patient here for evaluation after possible exposure to trichomonas.  Reports one of his partners informed him that her other partner is positive for trich.  Patient denies any symptoms.  Denies any trauma, injury, or other precipitating event.  Denies any specific alleviating or aggravating factors.  Denies any fevers, chest pain, shortness of breath, N/V/D, numbness, tingling, weakness, abdominal pain, or headaches.    The history is provided by the patient.  Exposure to STD   Past Medical History:  Diagnosis Date   Hypertension     Patient Active Problem List   Diagnosis Date Noted   Anxiety attack 04/16/2011   Anxiety disorder 04/16/2011   Weight loss, unintentional 04/16/2011   Diarrhea in adult patient 04/16/2011    History reviewed. No pertinent surgical history.     Home Medications    Prior to Admission medications   Medication Sig Start Date End Date Taking? Authorizing Provider  metroNIDAZOLE (FLAGYL) 500 MG tablet Take 1 tablet (500 mg total) by mouth 2 (two) times daily for 7 days. 09/19/20 09/26/20 Yes Ivette Loyal, NP  diazepam (VALIUM) 5 MG tablet Take 1 tablet (5 mg total) by mouth every 6 (six) hours as needed (spasms). 02/11/18   Mesner, Barbara Cower, MD  ibuprofen (ADVIL) 800 MG tablet Take 1 tablet (800 mg total) by mouth every 8 (eight) hours as needed. 01/23/20   Rolla Etienne, NP  meloxicam (MOBIC) 7.5 MG tablet Take 1 tablet (7.5 mg total) by mouth daily. 02/11/18   Mesner, Barbara Cower, MD  promethazine (PHENERGAN) 25 MG tablet Take 1 tablet (25 mg total) by mouth every 6 (six) hours as needed for nausea. 07/20/16   Fayrene Helper, PA-C    Family History History reviewed. No pertinent family history.  Social History Social History   Tobacco Use    Smoking status: Every Day    Packs/day: 1.00    Types: Cigarettes   Smokeless tobacco: Never  Substance Use Topics   Alcohol use: Yes    Comment: occ.   Drug use: Yes    Types: Marijuana    Comment: 2 days ago     Allergies   Patient has no known allergies.   Review of Systems Review of Systems  All other systems reviewed and are negative.   Physical Exam Triage Vital Signs ED Triage Vitals  Enc Vitals Group     BP 09/19/20 1823 (!) 142/82     Pulse Rate 09/19/20 1823 72     Resp 09/19/20 1823 16     Temp 09/19/20 1823 98.6 F (37 C)     Temp src --      SpO2 09/19/20 1823 100 %     Weight --      Height --      Head Circumference --      Peak Flow --      Pain Score 09/19/20 1825 0     Pain Loc --      Pain Edu? --      Excl. in GC? --    No data found.  Updated Vital Signs BP (!) 142/82   Pulse 72   Temp 98.6 F (37 C)   Resp 16  SpO2 100%   Visual Acuity Right Eye Distance:   Left Eye Distance:   Bilateral Distance:    Right Eye Near:   Left Eye Near:    Bilateral Near:     Physical Exam Vitals and nursing note reviewed.  Constitutional:      General: He is not in acute distress.    Appearance: Normal appearance. He is not ill-appearing, toxic-appearing or diaphoretic.  HENT:     Head: Normocephalic and atraumatic.  Eyes:     Conjunctiva/sclera: Conjunctivae normal.  Cardiovascular:     Rate and Rhythm: Normal rate.     Pulses: Normal pulses.  Pulmonary:     Effort: Pulmonary effort is normal.  Abdominal:     General: Abdomen is flat.  Genitourinary:    Comments: declines Musculoskeletal:        General: Normal range of motion.     Cervical back: Normal range of motion.  Skin:    General: Skin is warm and dry.  Neurological:     General: No focal deficit present.     Mental Status: He is alert and oriented to person, place, and time.  Psychiatric:        Mood and Affect: Mood normal.     UC Treatments / Results   Labs (all labs ordered are listed, but only abnormal results are displayed) Labs Reviewed  CYTOLOGY, (ORAL, ANAL, URETHRAL) ANCILLARY ONLY    EKG   Radiology No results found.  Procedures Procedures (including critical care time)  Medications Ordered in UC Medications - No data to display  Initial Impression / Assessment and Plan / UC Course  I have reviewed the triage vital signs and the nursing notes.  Pertinent labs & imaging results that were available during my care of the patient were reviewed by me and considered in my medical decision making (see chart for details).    Assessment negative for red flags or concerns.  Self swab obtained.  As patient has a possible exposure, we will go ahead and treat with flagyl.  Discussed safe sex practices as described in discharge instructions.  Follow up as needed.   Final Clinical Impressions(s) / UC Diagnoses   Final diagnoses:  Trichomonas exposure  Screen for STD (sexually transmitted disease)     Discharge Instructions      Take the Flagyl twice a day for the next 7 days.  Do not drink alcohol while taking this medication as it can make you sick.   We will contact you if the results from your lab work are positive and require additional treatment.    Do not have sex while taking undergoing treatment for STI.  Make sure that all of your partners get tested and treated.   Use a condom or other barrier method for all sexual encounters.    Return or go to the Emergency Department if symptoms worsen or do not improve in the next few days.      ED Prescriptions     Medication Sig Dispense Auth. Provider   metroNIDAZOLE (FLAGYL) 500 MG tablet Take 1 tablet (500 mg total) by mouth 2 (two) times daily for 7 days. 14 tablet Ivette Loyal, NP      PDMP not reviewed this encounter.   Ivette Loyal, NP 09/19/20 1911

## 2020-09-22 LAB — CYTOLOGY, (ORAL, ANAL, URETHRAL) ANCILLARY ONLY
Chlamydia: NEGATIVE
Comment: NEGATIVE
Comment: NEGATIVE
Comment: NORMAL
Neisseria Gonorrhea: NEGATIVE
Trichomonas: POSITIVE — AB

## 2020-10-14 ENCOUNTER — Emergency Department (HOSPITAL_COMMUNITY)
Admission: EM | Admit: 2020-10-14 | Discharge: 2020-10-15 | Disposition: A | Discharge status: Home / Self Care | Payer: Managed Care, Other (non HMO) | Attending: Emergency Medicine | Admitting: Emergency Medicine

## 2020-10-14 DIAGNOSIS — R102 Pelvic and perineal pain: Secondary | ICD-10-CM | POA: Insufficient documentation

## 2020-10-14 DIAGNOSIS — N50819 Testicular pain, unspecified: Secondary | ICD-10-CM | POA: Insufficient documentation

## 2020-10-14 DIAGNOSIS — F17210 Nicotine dependence, cigarettes, uncomplicated: Secondary | ICD-10-CM | POA: Insufficient documentation

## 2020-10-14 DIAGNOSIS — I10 Essential (primary) hypertension: Secondary | ICD-10-CM | POA: Insufficient documentation

## 2020-10-14 LAB — URINALYSIS, ROUTINE W REFLEX MICROSCOPIC
Bacteria, UA: NONE SEEN
Bilirubin Urine: NEGATIVE
Glucose, UA: NEGATIVE mg/dL
Ketones, ur: 5 mg/dL — AB
Leukocytes,Ua: NEGATIVE
Nitrite: NEGATIVE
Protein, ur: NEGATIVE mg/dL
Specific Gravity, Urine: 1.013 (ref 1.005–1.030)
pH: 5 (ref 5.0–8.0)

## 2020-10-14 NOTE — ED Provider Notes (Signed)
Emergency Medicine Provider Triage Evaluation Note  Danny Bishop , a 30 y.o. male  was evaluated in triage.  Pt complains of tightness to the testicles for 2 weeks. Recently treated for trich but had intercourse with someone else who was also dx with trich so he is concerned for persistent infection.  Review of Systems  Positive: Testicular discomfort Negative: Penile discharge  Physical Exam  BP (!) 131/91 (BP Location: Right Arm)   Pulse (!) 101   Temp 98.4 F (36.9 C) (Oral)   Resp 15   SpO2 99%  Gen:   Awake, no distress   Resp:  Normal effort  MSK:   Moves extremities without difficulty  Other:    Medical Decision Making  Medically screening exam initiated at 7:32 PM.  Appropriate orders placed.  Danny Bishop was informed that the remainder of the evaluation will be completed by another provider, this initial triage assessment does not replace that evaluation, and the importance of remaining in the ED until their evaluation is complete.  Pt declines hiv/rpr testing   Karrie Meres, PA-C 10/14/20 1934    Melene Plan, DO 10/14/20 2248

## 2020-10-14 NOTE — ED Triage Notes (Signed)
Pt c/o testicle pain x2wks. 2 sexual partners- one called & informed him that she had trich & chlamidya. Was seen at [facility] "a couple weeks ago," advised he had trich, took all of abx, had sex w another partner while on abx, "cannot sleep" due to pain.

## 2020-10-15 MED ORDER — KETOROLAC TROMETHAMINE 60 MG/2ML IM SOLN
30.0000 mg | Freq: Once | INTRAMUSCULAR | Status: AC
  Administered 2020-10-15: 30 mg via INTRAMUSCULAR
  Filled 2020-10-15: qty 2

## 2020-10-15 NOTE — ED Provider Notes (Signed)
[facility] EMERGENCY DEPARTMENT Provider Note  CSN: [id number] Arrival date & time: 10/14/20 1912  Chief Complaint(s) Testicle Pain and Exposure to STD  HPI Danny Bishop is a 30 y.o. male who presents to the emergency department with testicular pain.  He reports that he was treated for trichomonas 2 weeks ago with Flagyl.  He completed the course.  Reports that he had sexual intercourse with the same partner which she got trichomonas from while taking the antibiotics.  1 week after completing the antibiotics he began having intermittent testicular/perineal cramping.  Typically short-lived but now has been more prolonged.  No alleviating or aggravating factors.  Not exacerbated or triggered by anything.  He denies any discharge or dysuria.  The history is provided by the patient.   Past Medical History Past Medical History:  Diagnosis Date   Hypertension    Patient Active Problem List   Diagnosis Date Noted   Anxiety attack 04/16/2011   Anxiety disorder 04/16/2011   Weight loss, unintentional 04/16/2011   Diarrhea in adult patient 04/16/2011   Home Medication(s) Prior to Admission medications   Medication Sig Start Date End Date Taking? Authorizing Provider  diazepam (VALIUM) 5 MG tablet Take 1 tablet (5 mg total) by mouth every 6 (six) hours as needed (spasms). 02/11/18   Mesner, Barbara Cower, MD  ibuprofen (ADVIL) 800 MG tablet Take 1 tablet (800 mg total) by mouth every 8 (eight) hours as needed. 01/23/20   Rolla Etienne, NP  meloxicam (MOBIC) 7.5 MG tablet Take 1 tablet (7.5 mg total) by mouth daily. 02/11/18   Mesner, Barbara Cower, MD  promethazine (PHENERGAN) 25 MG tablet Take 1 tablet (25 mg total) by mouth every 6 (six) hours as needed for nausea. 07/20/16   Fayrene Helper, PA-C                                                                                                                                    Past Surgical History No past surgical history on file. Family  History No family history on file.  Social History Social History   Tobacco Use   Smoking status: Every Day    Packs/day: 1.00    Types: Cigarettes   Smokeless tobacco: Never  Substance Use Topics   Alcohol use: Yes    Comment: occ.   Drug use: Yes    Types: Marijuana    Comment: 2 days ago   Allergies Patient has no known allergies.  Review of Systems Review of Systems All other systems are reviewed and are negative for acute change except as noted in the HPI  Physical Exam Vital Signs  I have reviewed the triage vital signs BP 121/77   Pulse 70   Temp 98.4 F (36.9 C) (Oral)   Resp 15   SpO2 97%   Physical Exam Vitals reviewed.  Constitutional:      General: He is not in acute  distress.    Appearance: He is well-developed. He is not diaphoretic.  HENT:     Head: Normocephalic and atraumatic.     Right Ear: External ear normal.     Left Ear: External ear normal.     Nose: Nose normal.     Mouth/Throat:     Mouth: Mucous membranes are moist.  Eyes:     General: No scleral icterus.    Conjunctiva/sclera: Conjunctivae normal.  Neck:     Trachea: Phonation normal.  Cardiovascular:     Rate and Rhythm: Normal rate and regular rhythm.  Pulmonary:     Effort: Pulmonary effort is normal. No respiratory distress.     Breath sounds: No stridor.  Abdominal:     General: There is no distension.  Genitourinary:    Penis: Circumcised. No tenderness, discharge, swelling or lesions.      Testes:        Right: Mass, tenderness or swelling not present.        Left: Mass, tenderness or swelling not present.     Epididymis:     Right: Normal.     Left: Normal.  Musculoskeletal:        General: Normal range of motion.     Cervical back: Normal range of motion.  Lymphadenopathy:     Lower Body: No right inguinal adenopathy. No left inguinal adenopathy.  Neurological:     Mental Status: He is alert and oriented to person, place, and time.  Psychiatric:         Behavior: Behavior normal.    ED Results and Treatments Labs (all labs ordered are listed, but only abnormal results are displayed) Labs Reviewed  URINALYSIS, ROUTINE W REFLEX MICROSCOPIC - Abnormal; Notable for the following components:      Result Value   Hgb urine dipstick SMALL (*)    Ketones, ur 5 (*)    All other components within normal limits  GC/CHLAMYDIA PROBE AMP ([place]) NOT AT [facility]                                                                                                                         EKG  EKG Interpretation  Date/Time:    Ventricular Rate:    PR Interval:    QRS Duration:   QT Interval:    QTC Calculation:   R Axis:     Text Interpretation:         Radiology No results found.  Pertinent labs & imaging results that were available during my care of the patient were reviewed by me and considered in my medical decision making (see MDM for details).  Medications Ordered in ED Medications  ketorolac (TORADOL) injection 30 mg (has no administration in time range)  Procedures Procedures  (including critical care time)  Medical Decision Making / ED Course I have reviewed the nursing notes for this encounter and the patient's prior records (if available in EHR or on provided paperwork).  Danny Bishop was evaluated in Emergency Department on 10/15/2020 for the symptoms described in the history of present illness. He was evaluated in the context of the global COVID-19 pandemic, which necessitated consideration that the patient might be at risk for infection with the SARS-CoV-2 virus that causes COVID-19. Institutional protocols and algorithms that pertain to the evaluation of patients at risk for COVID-19 are in a state of rapid change based on information released by regulatory bodies including the CDC and federal and  state organizations. These policies and algorithms were followed during the patient's care in the ED.     Testicular/perineal pain. Nontender to palpation. No edema or swelling. Not consistent with epididymitis or torsion. No evidence of cellulitis.  UA without evidence of infection. GC/chlamydia sent. Patient had negative GC/chlamydia 3 weeks ago.  Will await repeat analysis.  No need for empiric treatment at this time.  Pertinent labs & imaging results that were available during my care of the patient were reviewed by me and considered in my medical decision making:    Final Clinical Impression(s) / ED Diagnoses Final diagnoses:  Perineum pain, male   The patient appears reasonably screened and/or stabilized for discharge and I doubt any other medical condition or other [facility] requiring further screening, evaluation, or treatment in the ED at this time prior to discharge. Safe for discharge with strict return precautions.  Disposition: Discharge  Condition: Good  I have discussed the results, Dx and Tx plan with the patient/family who expressed understanding and agree(s) with the plan. Discharge instructions discussed at length. The patient/family was given strict return precautions who verbalized understanding of the instructions. No further questions at time of discharge.    ED Discharge Orders     None        Follow Up: [facility] [address] [place] [address] [phone] Call  to schedule an appointment for close follow up    This chart was dictated using voice recognition software.  Despite best efforts to proofread,  errors can occur which can change the documentation meaning.    Nira Conn, MD 10/15/20 [phone]

## 2021-07-14 ENCOUNTER — Encounter (HOSPITAL_COMMUNITY): Payer: Self-pay

## 2021-07-14 ENCOUNTER — Other Ambulatory Visit: Payer: Self-pay

## 2021-07-14 ENCOUNTER — Emergency Department (HOSPITAL_COMMUNITY)
Admission: EM | Admit: 2021-07-14 | Discharge: 2021-07-14 | Disposition: A | Discharge status: Home / Self Care | Payer: Self-pay | Attending: Emergency Medicine | Admitting: Emergency Medicine

## 2021-07-14 DIAGNOSIS — I10 Essential (primary) hypertension: Secondary | ICD-10-CM | POA: Insufficient documentation

## 2021-07-14 DIAGNOSIS — F410 Panic disorder [episodic paroxysmal anxiety] without agoraphobia: Secondary | ICD-10-CM | POA: Insufficient documentation

## 2021-07-14 MED ORDER — HYDROXYZINE HCL 25 MG PO TABS: 25.0000 mg | ORAL_TABLET | Freq: Four times a day (QID) | ORAL | 0 refills | Status: DC | PRN

## 2021-07-14 NOTE — ED Provider Notes (Signed)
[facility] EMERGENCY DEPARTMENT Provider Note   CSN: [id number] Arrival date & time: 07/14/21  1435     History  Chief Complaint  Patient presents with   Panic Attack    Danny Bishop is a 31 y.o. male with PMHx HTN and anxiety disorder who presents to the ED today for panic attack. Pt reports he was at work today when he began hyperventilating and feeling like he couldn't catch his breath. Pt reports he sat down to try to catch his breath however it did not seem to help and he decided to come to the ED for further eval. He mentions hx of anxiety/panic attacks several years ago (~ 8-9 years ago) and was on medication for 2 years for anxiety however does not have a PCP anymore and therefore stopped taking the medication after he ran out of refills. He does admit that he has a lot going on recently. He mentions that his brother recently passed aware unexpectedly after being found unresponsive at home. He states he does believe that drugs may have been involved. He denies any drugs himself besides marijuana occasionally. Pt reports living an active lifestyle and does not have chest pain with same. He also denies any chest pain today. Denies any hx of syncope or any other family members with hx of sudden cardiac death/MI. He reports he currently feels improved however wanted to be checked out to make sure he was okay. He denies SI, HI, or AVH.   The history is provided by the patient and medical records.      Home Medications Prior to Admission medications   Medication Sig Start Date End Date Taking? Authorizing Provider  hydrOXYzine (ATARAX) 25 MG tablet Take 1 tablet (25 mg total) by mouth every 6 (six) hours as needed for anxiety. 07/14/21  Yes Shamirah Ivan, PA-C  diazepam (VALIUM) 5 MG tablet Take 1 tablet (5 mg total) by mouth every 6 (six) hours as needed (spasms). 02/11/18   Mesner, Corene Cornea, MD  ibuprofen (ADVIL) 800 MG tablet Take 1 tablet (800 mg total) by mouth  every 8 (eight) hours as needed. 01/23/20   Rudolpho Sevin, NP  meloxicam (MOBIC) 7.5 MG tablet Take 1 tablet (7.5 mg total) by mouth daily. 02/11/18   Mesner, Corene Cornea, MD  promethazine (PHENERGAN) 25 MG tablet Take 1 tablet (25 mg total) by mouth every 6 (six) hours as needed for nausea. 07/20/16   Domenic Moras, PA-C      Allergies    Eggs or egg-derived products    Review of Systems   Review of Systems  Constitutional:  Negative for chills and fever.  Respiratory:  Positive for shortness of breath.   Cardiovascular:  Positive for palpitations. Negative for chest pain and leg swelling.  Psychiatric/Behavioral:  Negative for hallucinations and suicidal ideas. The patient is nervous/anxious.   All other systems reviewed and are negative.  Physical Exam Updated Vital Signs BP (!) 158/99 (BP Location: Left Arm)   Pulse 94   Temp 97.8 F (36.6 C) (Oral)   Resp 18   Ht 6' (1.829 m)   Wt 77.6 kg   SpO2 99%   BMI 23.19 kg/m   Physical Exam Vitals and nursing note reviewed.  Constitutional:      Appearance: He is not ill-appearing.  HENT:     Head: Normocephalic and atraumatic.  Eyes:     Conjunctiva/sclera: Conjunctivae normal.  Cardiovascular:     Rate and Rhythm: Normal rate and regular  rhythm.     Pulses: Normal pulses.  Pulmonary:     Effort: Pulmonary effort is normal.     Breath sounds: Normal breath sounds. No wheezing, rhonchi or rales.  Abdominal:     Palpations: Abdomen is soft.     Tenderness: There is no abdominal tenderness.  Musculoskeletal:     Cervical back: Neck supple.  Skin:    General: Skin is warm and dry.  Neurological:     Mental Status: He is alert.    ED Results / Procedures / Treatments   Labs (all labs ordered are listed, but only abnormal results are displayed) Labs Reviewed - No data to display  EKG None  Radiology No results found.  Procedures Procedures    Medications Ordered in ED Medications - No data to display  ED Course/  Medical Decision Making/ A&P                           Medical Decision Making 31 year old male presenting to the ED today with complaint of palpitations/hyperventilating earlier today that he relates to a panic attack. Hx of anxiety disorder, not currently on meds for same. On arrival to the ED blood pressure elevated - pt reports hx of same however not on any meds. He was also initially tachy in the low 100s. EKG obtained in triage with sinus tachycardia and mild LVH however otherwise no acute ischemic changes. Pt in the ED for 5.5 hours prior to coming back to a room. He reports improvement in his symptoms at this time. He does admit to multiple life stressors currently. No SI, HI, or AVH. He did have a recent death in his family attributed to likely drug use. Pt denies any fhx of sudden cardiac death. Denies any hx of syncope for himself or chest pain. EKG does not appear consistent with HCOM at this time. I did offer lab work for patient for reassurance purposes however he states he feels better. I have very low suspicion for ACS, PE, dissection, myocarditis,pericarditis. He is in agreement to return to the ED for any recurrence/worsening symptoms. I will provide him information for [facility] as well as discharge with hydroxyzine to take PRN. Pt in agreement with plan at this time and otherwise stable for discharge home.   Problems Addressed: Panic attack: acute illness or injury          Final Clinical Impression(s) / ED Diagnoses Final diagnoses:  Panic attack    Rx / DC Orders ED Discharge Orders          Ordered    hydrOXYzine (ATARAX) 25 MG tablet  Every 6 hours PRN        07/14/21 2020             Discharge Instructions      Please pick up medication and take as needed for anxiety.   Follow up with [facility] as needed for anxiety.   Return to the ED for any new/worsening symptoms including worsening shortness of breath, new onset chest pain,  passing out, or any other new/concerning symptoms.        Eustaquio Maize, PA-C 07/14/21 2021    Regan Lemming, MD 07/15/21 0100

## 2021-07-14 NOTE — ED Notes (Signed)
Patient verbalizes understanding of discharge instructions. Opportunity for questioning and answers were provided. Armband removed by staff, pt discharged from ED ambulatory.   

## 2021-07-14 NOTE — ED Provider Triage Note (Signed)
Emergency Medicine Provider Triage Evaluation Note  Danny Bishop , a 31 y.o. male  was evaluated in triage.  Pt complains of anxiety attack earlier when he was at work.  Felt like he was having a heart attack per patient.  Tried to smoking Black and mild, which he usually does when he feels anxious, and felt like he was "going to die".  Feels much better and shares that his anxiety has essentially completely resolved.  Recently lost his brother within the last 2 weeks.  Hx of anxiety attacks, anxiety disorder, intermittent diarrhea, hypertension.  Denies chest pain, shortness of breath, nausea, vomiting, abdominal pain, headaches, vision changes, recent fevers or upper respiratory infections.  Review of Systems  Positive:  Negative: As above  Physical Exam  BP (!) 184/119 (BP Location: Right Arm)   Pulse (!) 121   Temp 98.8 F (37.1 C) (Oral)   Resp (!) 26   Ht 6' (1.829 m)   Wt 77.6 kg   SpO2 100%   BMI 23.19 kg/m  Gen:   Awake, no distress   Resp:  Normal effort, CTAB MSK:   Moves extremities without difficulty  Other:  RRR without M/R/G.  Heart rate about 80 bpm.  Not tachycardic, respiratory rate about 16-20.  Abdomen soft nontender.  Chest non-TTP.  Medical Decision Making  Medically screening exam initiated at 5:08 PM.  Appropriate orders placed.  Danny Bishop was informed that the remainder of the evaluation will be completed by another provider, this initial triage assessment does not replace that evaluation, and the importance of remaining in the ED until their evaluation is complete.  EKG ordered   Cecil Cobbs, PA-C 07/14/21 1747

## 2021-07-14 NOTE — Discharge Instructions (Signed)
Please pick up medication and take as needed for anxiety.   Follow up with [facility] as needed for anxiety.   Return to the ED for any new/worsening symptoms including worsening shortness of breath, new onset chest pain, passing out, or any other new/concerning symptoms.

## 2021-07-14 NOTE — ED Triage Notes (Signed)
Pt arrived POV from work c/o a panic attack. Pt states he was anxious, SHOB and felt like his pulse was racing. Pt is dealing with a death in the family currently.

## 2021-07-24 ENCOUNTER — Ambulatory Visit: Payer: Self-pay | Admitting: Student

## 2021-08-10 ENCOUNTER — Encounter (HOSPITAL_COMMUNITY): Payer: Self-pay

## 2021-08-10 ENCOUNTER — Other Ambulatory Visit: Payer: Self-pay

## 2021-08-10 ENCOUNTER — Emergency Department (HOSPITAL_COMMUNITY)
Admission: EM | Admit: 2021-08-10 | Discharge: 2021-08-10 | Disposition: A | Discharge status: Home / Self Care | Payer: Self-pay | Attending: Emergency Medicine | Admitting: Emergency Medicine

## 2021-08-10 ENCOUNTER — Emergency Department (HOSPITAL_COMMUNITY): Payer: Self-pay

## 2021-08-10 DIAGNOSIS — R002 Palpitations: Secondary | ICD-10-CM | POA: Insufficient documentation

## 2021-08-10 DIAGNOSIS — F419 Anxiety disorder, unspecified: Secondary | ICD-10-CM | POA: Insufficient documentation

## 2021-08-10 DIAGNOSIS — R000 Tachycardia, unspecified: Secondary | ICD-10-CM | POA: Insufficient documentation

## 2021-08-10 DIAGNOSIS — R0602 Shortness of breath: Secondary | ICD-10-CM | POA: Insufficient documentation

## 2021-08-10 HISTORY — DX: Anxiety disorder, unspecified: F41.9

## 2021-08-10 LAB — CBC WITH DIFFERENTIAL/PLATELET
Abs Immature Granulocytes: 0.03 10*3/uL (ref 0.00–0.07)
Basophils Absolute: 0 10*3/uL (ref 0.0–0.1)
Basophils Relative: 0 %
Eosinophils Absolute: 0 10*3/uL (ref 0.0–0.5)
Eosinophils Relative: 0 %
HCT: 42.1 % (ref 39.0–52.0)
Hemoglobin: 15.2 g/dL (ref 13.0–17.0)
Immature Granulocytes: 1 %
Lymphocytes Relative: 34 %
Lymphs Abs: 2.1 10*3/uL (ref 0.7–4.0)
MCH: 32.3 pg (ref 26.0–34.0)
MCHC: 36.1 g/dL — ABNORMAL HIGH (ref 30.0–36.0)
MCV: 89.4 fL (ref 80.0–100.0)
Monocytes Absolute: 0.7 10*3/uL (ref 0.1–1.0)
Monocytes Relative: 11 %
Neutro Abs: 3.3 10*3/uL (ref 1.7–7.7)
Neutrophils Relative %: 54 %
Platelets: 270 10*3/uL (ref 150–400)
RBC: 4.71 MIL/uL (ref 4.22–5.81)
RDW: 12.7 % (ref 11.5–15.5)
WBC: 6.1 10*3/uL (ref 4.0–10.5)
nRBC: 0 % (ref 0.0–0.2)

## 2021-08-10 LAB — TROPONIN I (HIGH SENSITIVITY)
Troponin I (High Sensitivity): 2 ng/L (ref <18)
Troponin I (High Sensitivity): 2 ng/L (ref <18)

## 2021-08-10 LAB — BASIC METABOLIC PANEL
Anion gap: 7 (ref 5–15)
BUN: 11 mg/dL (ref 6–20)
CO2: 28 mmol/L (ref 22–32)
Calcium: 9.5 mg/dL (ref 8.9–10.3)
Chloride: 106 mmol/L (ref 98–111)
Creatinine, Ser: 0.88 mg/dL (ref 0.61–1.24)
GFR, Estimated: >60 mL/min (ref >60)
Glucose, Bld: 103 mg/dL — ABNORMAL HIGH (ref 70–99)
Potassium: 3.7 mmol/L (ref 3.5–5.1)
Sodium: 141 mmol/L (ref 135–145)

## 2021-08-10 LAB — D-DIMER, QUANTITATIVE: D-Dimer, Quant: 0.58 ug/mL-FEU — ABNORMAL HIGH (ref 0.00–0.50)

## 2021-08-10 MED ORDER — IOHEXOL 350 MG/ML SOLN
80.0000 mL | Freq: Once | INTRAVENOUS | Status: AC | PRN
  Administered 2021-08-10: 80 mL via INTRAVENOUS

## 2021-08-10 MED ORDER — HYDROXYZINE HCL 25 MG PO TABS: 25.0000 mg | ORAL_TABLET | Freq: Three times a day (TID) | ORAL | 0 refills | Status: DC | PRN

## 2021-08-10 MED ORDER — SODIUM CHLORIDE 0.9 % IV BOLUS
1000.0000 mL | Freq: Once | INTRAVENOUS | Status: AC
  Administered 2021-08-10: 1000 mL via INTRAVENOUS

## 2021-08-10 NOTE — ED Triage Notes (Addendum)
Patient states he has been having episodes of SOB and chest pain x 2 weeks. Patient states "I can't catch my breath." Patient states, "I think I blacked out for a second or two while I was driving."   Patient states he was prescribed medication for anxiety on 07/14/21 and states this has not helped. Patient states while at work he only took half because it makes him so sleepy.

## 2021-09-21 ENCOUNTER — Ambulatory Visit: Payer: Self-pay | Attending: Internal Medicine | Admitting: Internal Medicine

## 2021-09-21 ENCOUNTER — Encounter: Payer: Self-pay | Admitting: Internal Medicine

## 2021-09-21 ENCOUNTER — Other Ambulatory Visit: Payer: Self-pay

## 2021-09-21 VITALS — BP 153/105 | HR 90 | Temp 98.1°F | Ht 72.0 in | Wt 178.0 lb

## 2021-09-21 DIAGNOSIS — F17200 Nicotine dependence, unspecified, uncomplicated: Secondary | ICD-10-CM | POA: Insufficient documentation

## 2021-09-21 DIAGNOSIS — Z7689 Persons encountering health services in other specified circumstances: Secondary | ICD-10-CM

## 2021-09-21 DIAGNOSIS — Z1331 Encounter for screening for depression: Secondary | ICD-10-CM | POA: Insufficient documentation

## 2021-09-21 DIAGNOSIS — F411 Generalized anxiety disorder: Secondary | ICD-10-CM

## 2021-09-21 DIAGNOSIS — Z6379 Other stressful life events affecting family and household: Secondary | ICD-10-CM | POA: Insufficient documentation

## 2021-09-21 DIAGNOSIS — D573 Sickle-cell trait: Secondary | ICD-10-CM | POA: Insufficient documentation

## 2021-09-21 DIAGNOSIS — I10 Essential (primary) hypertension: Secondary | ICD-10-CM | POA: Insufficient documentation

## 2021-09-21 MED ORDER — AMLODIPINE BESYLATE 5 MG PO TABS
5.0000 mg | ORAL_TABLET | Freq: Every day | ORAL | 3 refills | Status: DC
  Filled 2021-09-21: qty 30, 30d supply, fill #0
  Filled 2021-11-09: qty 30, 30d supply, fill #1

## 2021-09-21 MED ORDER — SERTRALINE HCL 25 MG PO TABS
25.0000 mg | ORAL_TABLET | Freq: Every day | ORAL | 3 refills | Status: DC
  Filled 2021-09-21: qty 30, 30d supply, fill #0
  Filled 2021-11-09: qty 30, 30d supply, fill #1

## 2021-09-21 MED ORDER — HYDROXYZINE HCL 25 MG PO TABS
25.0000 mg | ORAL_TABLET | Freq: Two times a day (BID) | ORAL | 1 refills | Status: DC | PRN
  Filled 2021-09-21: qty 60, 30d supply, fill #0
  Filled 2021-11-09 – 2021-11-27 (×2): qty 60, 30d supply, fill #1

## 2021-09-21 NOTE — Progress Notes (Signed)
Patient ID: OHM DENTLER, male    DOB: 08/08/1990  MRN: [id number]  CC: Hospitalization Follow-up   Subjective: Danny Bishop is a 31 y.o. male who presents for new pt visit and ER f/u for anxiety  His concerns today include:  Patient with history of tobacco dependence, GAD, weight loss  No previous PCP.  Seen in ER a few times this yr with SOB and palpitations.  Dx with anx/panic disorder.  Rxn Hydroxyzine Hydroxyzine helps when he is asleep but not when he is awake Gets episodes where he feels "anxious, jittery and jumpy" x 4 mths.  Hyperventilates and gets feeling of dome like something bad will happen to him.  He spends a lot of time worrying about when the next episode will occur.  Worries about work, his kids (pays child support for 3 kids and about to go to court for another one. Now expecting another child with his current girldfriend).  Use to get food stamps but that ended. Worry about getting bills paid on time.  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  Anxiety episodes use to occur several times a wk but started happening every day after his brother passed at age 26 in May 2023 from heart attack. He and his brother were close.  Sleeping about 4-5 1/2 hrs at nights due to his schedule. Getting in meals on time.   Gained a few lbs.  Snacking on junk foods, eating fast foods for lunch and dinner, drinks sodas and other sugary drinks.  BP elev. Reports being told BP high "everywhere I go."  Never treated.  Not sure if there is fhx of BP I note that most of his readings in the system are elevated.  Tob dep:  smokes 1/2 pk a day He smoked for 10 yrs but has cut back.  Use to smoke 1 pk a day, drank ETOH beverages more than he should have and use marj.  Stopped 4 mths ago after his brother died. Patient Active Problem List   Diagnosis Date Noted   Sickle cell trait (HCC) 09/21/2021   Anxiety attack 04/16/2011   Anxiety disorder 04/16/2011    Weight loss, unintentional 04/16/2011   Diarrhea in adult patient 04/16/2011     No current outpatient medications on file prior to visit.   No current facility-administered medications on file prior to visit.    Allergies  Allergen Reactions   Eggs Or Egg-Derived Products Nausea And Vomiting    Social History   Socioeconomic History   Marital status: Single    Spouse name: Not on file   Number of children: 5   Years of education: Not on file   Highest education level: Not on file  Occupational History   Occupation: Location manager  Tobacco Use   Smoking status: Every Day    Packs/day: 0.50    Years: 10.00    Total pack years: 5.00    Types: Cigarettes   Smokeless tobacco: Never  Vaping Use   Vaping Use: Never used  Substance and Sexual Activity   Alcohol use: Yes    Alcohol/week: 7.0 standard drinks of alcohol    Types: 7 Cans of beer per week    Comment: 12 oz   Drug use: Not Currently    Types: Marijuana   Sexual activity: Not on file  Other Topics Concern   Not on file  Social History Narrative   Not on file  Social Determinants of Health   Financial Resource Strain: Not on file  Food Insecurity: Not on file  Transportation Needs: Not on file  Physical Activity: Not on file  Stress: Not on file  Social Connections: Not on file  Intimate Partner Violence: Not on file    Family History  Problem Relation Age of Onset   Sickle cell trait Father    CAD Brother    Sickle cell trait Brother     Past Surgical History:  Procedure Laterality Date   HAND SURGERY Right 2016   due to fracture involving index MCP    ROS: Review of Systems Negative except as stated above  PHYSICAL EXAM: BP (!) 153/105   Pulse 90   Temp 98.1 F (36.7 C)   Ht 6' (1.829 m)   Wt 178 lb (80.7 kg)   SpO2 98%   BMI 24.14 kg/m   Wt Readings from Last 3 Encounters:  09/21/21 178 lb (80.7 kg)  08/10/21 171 lb (77.6 kg)  07/14/21 171 lb (77.6 kg)  Repeat BP  154/110  Physical Exam  General appearance - alert, well appearing, young African-American male and in no distress Mental status - normal mood, behavior, speech, dress, motor activity, and thought processes Neck - supple, no significant adenopathy Chest - clear to auscultation, no wheezes, rales or rhonchi, symmetric air entry Heart - normal rate, regular rhythm, normal S1, S2, no murmurs, rubs, clicks or gallops Extremities - peripheral pulses normal, no pedal edema, no clubbing or cyanosis      09/21/2021   10:02 AM  GAD 7 : Generalized Anxiety Score  Nervous, Anxious, on Edge 3  Control/stop worrying 3  Worry too much - different things 3  Trouble relaxing 1  Restless 0  Easily annoyed or irritable 0  Afraid - awful might happen 3  Total GAD 7 Score 13       09/21/2021   10:01 AM 04/16/2011   11:06 AM  Depression screen PHQ 2/9  Decreased Interest 0 0  Down, Depressed, Hopeless 1 1  PHQ - 2 Score 1 1  Altered sleeping 1   Tired, decreased energy 1   Change in appetite 0   Feeling bad or failure about yourself  0   Trouble concentrating 0   Moving slowly or fidgety/restless 1   Suicidal thoughts 0   PHQ-9 Score 4        Latest Ref Rng & Units 08/10/2021    6:10 PM 10/17/2019    8:25 PM 07/19/2016   10:37 PM  CMP  Glucose 70 - 99 mg/dL 103  93  91   BUN 6 - 20 mg/dL 11  8  9    Creatinine 0.61 - 1.24 mg/dL 0.88  0.75  0.96   Sodium 135 - 145 mmol/L 141  142  138   Potassium 3.5 - 5.1 mmol/L 3.7  3.6  3.4   Chloride 98 - 111 mmol/L 106  105  100   CO2 22 - 32 mmol/L 28  26  30    Calcium 8.9 - 10.3 mg/dL 9.5  9.4  9.1   Total Protein 6.5 - 8.1 g/dL   7.6   Total Bilirubin 0.3 - 1.2 mg/dL   0.8   Alkaline Phos 38 - 126 U/L   105   AST 15 - 41 U/L   21   ALT 17 - 63 U/L   30    CBC    Component Value Date/Time   WBC  6.1 08/10/2021 1810   RBC 4.71 08/10/2021 1810   HGB 15.2 08/10/2021 1810   HCT 42.1 08/10/2021 1810   PLT 270 08/10/2021 1810   MCV 89.4  08/10/2021 1810   MCH 32.3 08/10/2021 1810   MCHC 36.1 (H) 08/10/2021 1810   RDW 12.7 08/10/2021 1810   LYMPHSABS 2.1 08/10/2021 1810   MONOABS 0.7 08/10/2021 1810   EOSABS 0.0 08/10/2021 1810   BASOSABS 0.0 08/10/2021 1810    ASSESSMENT AND PLAN:  1. Encounter to establish care   2. GAD (generalized anxiety disorder) Seems to have been triggered by the recent death of his brother and increased financial stress that he is currently experiencing -We discussed management of anxiety disorder with CBT alone or in combination with medication.  Patient agrees to the latter.  He will continue the hydroxyzine as needed.  I recommend adding an SSRI in the form of Zoloft.  Went over side effects.  Advised that the medication can take about 4 weeks before he starts feeling better.  We will start on a low-dose and titrate as needed.  He is agreeable to meeting with our LCSW.  I will also refer him to [facility] for medication management. - sertraline (ZOLOFT) 25 MG tablet; Take 1 tablet (25 mg total) by mouth daily.  Dispense: 30 tablet; Refill: 3 - hydrOXYzine (ATARAX) 25 MG tablet; Take 1 tablet (25 mg total) by mouth 2 (two) times daily as needed for anxiety.  Dispense: 60 tablet; Refill: 1 - Ambulatory referral to Psychiatry  3. Essential hypertension DASH diet discussed and encouraged. Since blood pressure has been elevated on most of his encounters with the health system over the past several months, I recommend starting antihypertensive.  Patient is agreeable to this. - amLODipine (NORVASC) 5 MG tablet; Take 1 tablet (5 mg total) by mouth daily.  Dispense: 30 tablet; Refill: 3  4. Tobacco dependence Advised to quit. Discussed health risks associated with smoking.  Patient not ready to give a trial of quitting but states he will try to cut down more  5. Positive depression screening Patient feels that this is not a major issue for him as much as the anxiety is.  Told  that Zoloft can help with depression as well.  6. Stressful life event affecting family - Ambulatory referral to Psychiatry    Patient was given the opportunity to ask questions.  Patient verbalized understanding of the plan and was able to repeat key elements of the plan.   This documentation was completed using Paediatric nurse.  Any transcriptional errors are unintentional.  Orders Placed This Encounter  Procedures   Ambulatory referral to Psychiatry     Requested Prescriptions   Signed Prescriptions Disp Refills   sertraline (ZOLOFT) 25 MG tablet 30 tablet 3    Sig: Take 1 tablet (25 mg total) by mouth daily.   hydrOXYzine (ATARAX) 25 MG tablet 60 tablet 1    Sig: Take 1 tablet (25 mg total) by mouth 2 (two) times daily as needed for anxiety.   amLODipine (NORVASC) 5 MG tablet 30 tablet 3    Sig: Take 1 tablet (5 mg total) by mouth daily.    Return in about 6 weeks (around 11/02/2021).  Jonah Blue, MD, FACP

## 2021-09-21 NOTE — Patient Instructions (Signed)
I have sent the prescription to your pharmacy for the blood pressure medication called amlodipine 5 mg daily. Continue hydroxyzine as needed for anxiety.  We have added the medication called Zoloft 25 mg daily for anxiety.  Our licensed clinical social worker will touch base with you sometime within the next week to discuss some counseling.  I referred you to [facility] behavioral health as well.  Healthy Eating Following a healthy eating pattern may help you to achieve and maintain a healthy body weight, reduce the risk of chronic disease, and live a long and productive life. It is important to follow a healthy eating pattern at an appropriate calorie level for your body. Your nutritional needs should be met primarily through food by choosing a variety of nutrient-rich foods. What are tips for following this plan? Reading food labels Read labels and choose the following: Reduced or low sodium. Juices with 100% fruit juice. Foods with low saturated fats and high polyunsaturated and monounsaturated fats. Foods with whole grains, such as whole wheat, cracked wheat, brown rice, and wild rice. Whole grains that are fortified with folic acid. This is recommended for women who are pregnant or who want to become pregnant. Read labels and avoid the following: Foods with a lot of added sugars. These include foods that contain brown sugar, corn sweetener, corn syrup, dextrose, fructose, glucose, high-fructose corn syrup, honey, invert sugar, lactose, malt syrup, maltose, molasses, raw sugar, sucrose, trehalose, or turbinado sugar. Do not eat more than the following amounts of added sugar per day: 6 teaspoons (25 g) for women. 9 teaspoons (38 g) for men. Foods that contain processed or refined starches and grains. Refined grain products, such as white flour, degermed cornmeal, white bread, and white rice. Shopping Choose nutrient-rich snacks, such as vegetables, whole fruits, and nuts. Avoid  high-calorie and high-sugar snacks, such as potato chips, fruit snacks, and candy. Use oil-based dressings and spreads on foods instead of solid fats such as butter, stick margarine, or cream cheese. Limit pre-made sauces, mixes, and "instant" products such as flavored rice, instant noodles, and ready-made pasta. Try more plant-protein sources, such as tofu, tempeh, black beans, edamame, lentils, nuts, and seeds. Explore eating plans such as the Mediterranean diet or vegetarian diet. Cooking Use oil to saut or stir-fry foods instead of solid fats such as butter, stick margarine, or lard. Try baking, boiling, grilling, or broiling instead of frying. Remove the fatty part of meats before cooking. Steam vegetables in water or broth. Meal planning  At meals, imagine dividing your plate into fourths: One-half of your plate is fruits and vegetables. One-fourth of your plate is whole grains. One-fourth of your plate is protein, especially lean meats, poultry, eggs, tofu, beans, or nuts. Include low-fat dairy as part of your daily diet. Lifestyle Choose healthy options in all settings, including home, work, school, restaurants, or stores. Prepare your food safely: Wash your hands after handling raw meats. Keep food preparation surfaces clean by regularly washing with hot, soapy water. Keep raw meats separate from ready-to-eat foods, such as fruits and vegetables. Cook seafood, meat, poultry, and eggs to the recommended internal temperature. Store foods at safe temperatures. In general: Keep cold foods at 72F (4.4C) or below. Keep hot foods at 172F (60C) or above. Keep your freezer at [facility] (-17.8C) or below. Foods are no longer safe to eat when they have been between the temperatures of 40-172F (4.4-60C) for more than 2 hours. What foods should I eat? Fruits Aim to eat 2  cup-equivalents of fresh, canned (in natural juice), or frozen fruits each day. Examples of 1 cup-equivalent of fruit  include 1 small apple, 8 large strawberries, 1 cup canned fruit,  cup dried fruit, or 1 cup 100% juice. Vegetables Aim to eat 2-3 cup-equivalents of fresh and frozen vegetables each day, including different varieties and colors. Examples of 1 cup-equivalent of vegetables include 2 medium carrots, 2 cups raw, leafy greens, 1 cup chopped vegetable (raw or cooked), or 1 medium baked potato. Grains Aim to eat 6 ounce-equivalents of whole grains each day. Examples of 1 ounce-equivalent of grains include 1 slice of bread, 1 cup ready-to-eat cereal, 3 cups popcorn, or  cup cooked rice, pasta, or cereal. Meats and other proteins Aim to eat 5-6 ounce-equivalents of protein each day. Examples of 1 ounce-equivalent of protein include 1 egg, 1/2 cup nuts or seeds, or 1 tablespoon (16 g) peanut butter. A cut of meat or fish that is the size of a deck of cards is about 3-4 ounce-equivalents. Of the protein you eat each week, try to have at least 8 ounces come from seafood. This includes salmon, trout, herring, and anchovies. Dairy Aim to eat 3 cup-equivalents of fat-free or low-fat dairy each day. Examples of 1 cup-equivalent of dairy include 1 cup (240 mL) milk, 8 ounces (250 g) yogurt, 1 ounces (44 g) natural cheese, or 1 cup (240 mL) fortified soy milk. Fats and oils Aim for about 5 teaspoons (21 g) per day. Choose monounsaturated fats, such as canola and olive oils, avocados, peanut butter, and most nuts, or polyunsaturated fats, such as sunflower, corn, and soybean oils, walnuts, pine nuts, sesame seeds, sunflower seeds, and flaxseed. Beverages Aim for six 8-oz glasses of water per day. Limit coffee to three to five 8-oz cups per day. Limit caffeinated beverages that have added calories, such as soda and energy drinks. Limit alcohol intake to no more than 1 drink a day for nonpregnant women and 2 drinks a day for men. One drink equals 12 oz of beer (355 mL), 5 oz of wine (148 mL), or 1 oz of hard liquor  (44 mL). Seasoning and other foods Avoid adding excess amounts of salt to your foods. Try flavoring foods with herbs and spices instead of salt. Avoid adding sugar to foods. Try using oil-based dressings, sauces, and spreads instead of solid fats. This information is based on general [place] nutrition guidelines. For more information, visit [employer].es. Exact amounts may vary based on your nutrition needs. Summary A healthy eating plan may help you to maintain a healthy weight, reduce the risk of chronic diseases, and stay active throughout your life. Plan your meals. Make sure you eat the right portions of a variety of nutrient-rich foods. Try baking, boiling, grilling, or broiling instead of frying. Choose healthy options in all settings, including home, work, school, restaurants, or stores. This information is not intended to replace advice given to you by your health care provider. Make sure you discuss any questions you have with your health care provider. Document Revised: 09/30/2020 Document Reviewed: 09/30/2020 Elsevier Patient Education  [employer].

## 2021-09-21 NOTE — Progress Notes (Signed)
Pt requesting RF on Hydroxyzine 25mg . Pt request Rx for anxiety.

## 2021-09-22 ENCOUNTER — Other Ambulatory Visit: Payer: Self-pay

## 2021-09-23 ENCOUNTER — Other Ambulatory Visit: Payer: Self-pay

## 2021-09-25 ENCOUNTER — Other Ambulatory Visit: Payer: Self-pay

## 2021-10-12 ENCOUNTER — Telehealth: Payer: Self-pay | Admitting: Licensed Clinical Social Worker

## 2021-10-12 NOTE — Telephone Encounter (Signed)
LCSWA called patient today to introduce myself and to check in on patients wellbeing. LCSWA was able to schedule an appointment for patient for therapy the same day as his visit with PCP in September. Patient stated that his mediation for anxiety was not working for him it makes him feel "loopy and spaced out" he reports he took it for a week and stopped. Pt said he is still taking the other medications. LCSWA asked patient to call the office if he has any concerns before his appointment.  Patient was referred by PCP:  " 09/25/21-He has anxiety that has been made worse by recent death of his brother in 2022/07/19 of this year and increased financial stress.  Please refer to my note from today.  He would like to have some counseling sessions.  I told him that she will touch base with him.  He is on hydroxyzine and I started Zoloft as well."

## 2021-10-14 ENCOUNTER — Emergency Department (HOSPITAL_COMMUNITY)
Admission: EM | Admit: 2021-10-14 | Discharge: 2021-10-14 | Discharge status: Against Medical Advice | Payer: Self-pay | Attending: Emergency Medicine | Admitting: Emergency Medicine

## 2021-10-14 ENCOUNTER — Encounter (HOSPITAL_COMMUNITY): Payer: Self-pay | Admitting: Emergency Medicine

## 2021-10-14 ENCOUNTER — Other Ambulatory Visit: Payer: Self-pay

## 2021-10-14 DIAGNOSIS — R002 Palpitations: Secondary | ICD-10-CM | POA: Insufficient documentation

## 2021-10-14 DIAGNOSIS — Z5321 Procedure and treatment not carried out due to patient leaving prior to being seen by health care provider: Secondary | ICD-10-CM | POA: Insufficient documentation

## 2021-10-14 DIAGNOSIS — R42 Dizziness and giddiness: Secondary | ICD-10-CM | POA: Insufficient documentation

## 2021-10-14 LAB — CBC WITH DIFFERENTIAL/PLATELET
Abs Immature Granulocytes: 0.02 10*3/uL (ref 0.00–0.07)
Basophils Absolute: 0 10*3/uL (ref 0.0–0.1)
Basophils Relative: 0 %
Eosinophils Absolute: 0 10*3/uL (ref 0.0–0.5)
Eosinophils Relative: 0 %
HCT: 39.8 % (ref 39.0–52.0)
Hemoglobin: 14 g/dL (ref 13.0–17.0)
Immature Granulocytes: 0 %
Lymphocytes Relative: 36 %
Lymphs Abs: 1.7 10*3/uL (ref 0.7–4.0)
MCH: 32.3 pg (ref 26.0–34.0)
MCHC: 35.2 g/dL (ref 30.0–36.0)
MCV: 91.7 fL (ref 80.0–100.0)
Monocytes Absolute: 0.4 10*3/uL (ref 0.1–1.0)
Monocytes Relative: 8 %
Neutro Abs: 2.7 10*3/uL (ref 1.7–7.7)
Neutrophils Relative %: 56 %
Platelets: 285 10*3/uL (ref 150–400)
RBC: 4.34 MIL/uL (ref 4.22–5.81)
RDW: 13.3 % (ref 11.5–15.5)
WBC: 4.8 10*3/uL (ref 4.0–10.5)
nRBC: 0 % (ref 0.0–0.2)

## 2021-10-14 LAB — COMPREHENSIVE METABOLIC PANEL
ALT: 29 U/L (ref 0–44)
AST: 20 U/L (ref 15–41)
Albumin: 4.4 g/dL (ref 3.5–5.0)
Alkaline Phosphatase: 108 U/L (ref 38–126)
Anion gap: 6 (ref 5–15)
BUN: 11 mg/dL (ref 6–20)
CO2: 26 mmol/L (ref 22–32)
Calcium: 9 mg/dL (ref 8.9–10.3)
Chloride: 107 mmol/L (ref 98–111)
Creatinine, Ser: 0.52 mg/dL — ABNORMAL LOW (ref 0.61–1.24)
GFR, Estimated: >60 mL/min (ref >60)
Glucose, Bld: 98 mg/dL (ref 70–99)
Potassium: 3.9 mmol/L (ref 3.5–5.1)
Sodium: 139 mmol/L (ref 135–145)
Total Bilirubin: 0.6 mg/dL (ref 0.3–1.2)
Total Protein: 7.6 g/dL (ref 6.5–8.1)

## 2021-10-14 LAB — TSH: TSH: 0.656 u[IU]/mL (ref 0.350–4.500)

## 2021-10-14 LAB — MAGNESIUM: Magnesium: 1.9 mg/dL (ref 1.7–2.4)

## 2021-10-14 NOTE — ED Provider Triage Note (Signed)
Emergency Medicine Provider Triage Evaluation Note  Danny Bishop , a 31 y.o. male  was evaluated in triage.  Pt complains of palpitations. States for about 120 days he has been having these episodes of feeling short of breath and having his heart racing.  He has associated dizziness and the time.  He had this happen today while at work to the point where he had to come to the emergency department.  Feels that the episodes are happening more more often and that they are getting more severe.  He says there are times where he feels completely normal. Only thing he thinks that is changes that he started taking Atarax and amlodipine.  He has also been prescribed sertraline, however he has not been taking this as it makes him feel funny. Has been told that these are anxiety attacks, however he does not feel that this is the case as he knows his body.  He is very anxious that he is going to get fired from work as he is constantly being out due to these attacks. He denies any increase in caffeine, states that he stopped smoking marijuana and has no other drug use, he does not drink alcohol.  Review of Systems  Positive:  Negative:   Physical Exam  BP (!) 178/120   Pulse 92   Temp 98.5 F (36.9 C)   Resp 20   SpO2 100%  Gen:   Awake, no distress   Resp:  Normal effort  MSK:   Moves extremities without difficulty  Other:    Medical Decision Making  Medically screening exam initiated at 12:28 PM.  Appropriate orders placed.  TYREON FRIGON was informed that the remainder of the evaluation will be completed by another provider, this initial triage assessment does not replace that evaluation, and the importance of remaining in the ED until their evaluation is complete.     Claudie Leach, [place] 10/14/21 1236

## 2021-10-14 NOTE — Telephone Encounter (Signed)
RTC spoke w/ pt he was informed the Rx was Sertraline that made him feel dizzy/loopy. Informed pt pcp will be notifed. Pt expressed understanding. Please advise. ----DD,RMA

## 2021-10-14 NOTE — ED Triage Notes (Signed)
Pt reports feeling like he was going to pass out at work. Pt reports dizziness at this time.

## 2021-10-14 NOTE — Telephone Encounter (Signed)
Pt informed of note per pcp. Pt expressed understanding.----DD,RMA 

## 2021-11-09 ENCOUNTER — Telehealth: Payer: Self-pay | Admitting: Internal Medicine

## 2021-11-09 ENCOUNTER — Telehealth: Payer: Self-pay | Admitting: Licensed Clinical Social Worker

## 2021-11-09 ENCOUNTER — Ambulatory Visit: Payer: Self-pay | Admitting: Internal Medicine

## 2021-11-09 ENCOUNTER — Ambulatory Visit: Payer: Self-pay | Attending: Licensed Clinical Social Worker | Admitting: Licensed Clinical Social Worker

## 2021-11-09 ENCOUNTER — Other Ambulatory Visit: Payer: Self-pay

## 2021-11-09 DIAGNOSIS — I10 Essential (primary) hypertension: Secondary | ICD-10-CM

## 2021-11-09 DIAGNOSIS — F411 Generalized anxiety disorder: Secondary | ICD-10-CM

## 2021-11-09 DIAGNOSIS — F410 Panic disorder [episodic paroxysmal anxiety] without agoraphobia: Secondary | ICD-10-CM

## 2021-11-09 MED ORDER — BUSPIRONE HCL 5 MG PO TABS
5.0000 mg | ORAL_TABLET | Freq: Two times a day (BID) | ORAL | 1 refills | Status: DC
  Filled 2021-11-09 – 2021-11-27 (×2): qty 60, 30d supply, fill #0

## 2021-11-09 MED ORDER — AMLODIPINE BESYLATE 5 MG PO TABS
5.0000 mg | ORAL_TABLET | Freq: Every day | ORAL | 5 refills | Status: DC
  Filled 2021-11-27 – 2022-01-06 (×2): qty 30, 30d supply, fill #0
  Filled 2022-01-28: qty 30, 30d supply, fill #1

## 2021-11-09 NOTE — BH Specialist Note (Signed)
Integrated Behavioral Health Initial In-Person Visit  MRN: [id number] Name: Danny Bishop  Number of Integrated Behavioral Health Clinician visits: 1- Initial Visit  Session Start time: 06-03-07    Session End time: 0419  Total time in minutes: 790   Types of Service: Individual psychotherapy  Interpretor:No.    Warm Hand Off Completed.    Subjective: Danny Bishop is a 31 y.o. male accompanied by  himself. Patient was referred by PCP for anxiety and panic attacks. Patient reports the following symptoms/concerns: very anxious and often has panic attacks Duration of problem: for a few years; Severity of problem: moderate  Objective: Mood: Anxious and Euthymic and Affect: Appropriate Risk of harm to self or others: No plan to harm self or others  Life Context: Family and Social: good relationship with his parents and brother and several friends. School/Work: reports that he works and often has to travel for work  Self-Care: driving to relax his mind  Life Changes: brother, best friend, good friend of the family passed away in 06-02-21 and grandmother passed away in 2019/06/03; history of being in prison was released in 06-03-18. Has 2 new children born this year.  Patient and/or Family's Strengths/Protective Factors: Social connections and Social and Emotional competence  Goals Addressed: Patient will: Reduce symptoms of: anxiety and stress Increase knowledge and/or ability of: coping skills and stress reduction  Demonstrate ability to: Increase healthy adjustment to current life circumstances and Begin healthy grieving over loss  Progress towards Goals: Ongoing  Interventions: Interventions utilized: CBT Cognitive Behavioral Therapy and Communication Skills  Standardized Assessments completed: GAD-7 and PHQ 9  Patient and/or Family Response: Patient reports significant loses in the past two years of loved ones. Patient reports having a history of alcohol and marijuana abuse, but  reports that he no longer will do those things. Patient recognized that he needs to identify what stage he is in, in his grieving process. Patient talked about his relationships with children's mother and how difficult and stressful it has been and the lack of financial stability he has right now. Patient stated that he often has thoughts of just running away and starting over. Patient reports not wanting to be alone. He doesn't like to sit alone in his house. He will often pace back and forth. Patient is open to trying a different medication to reduce his anxiety.   Patient Centered Plan: Patient is on the following Treatment Plan(s):  Anxiety   Assessment: Patient currently experiencing lack of sleep, doesn't like to be alone and doesn't like to talk about his brothers passing, stress from having child support with all of his children.   Patient may benefit from continued support form [facility] to identify needs and increase knowledge and use coping skills. Patient may also benefit from LCSWA linking back with PCP for medications to reduce his anxiety and continued medications to lower blood pressure.  Plan: Follow up with behavioral health clinician on : in 4 weeks Behavioral recommendations: Reduce stress with financial things by applying for orange card to reduce medical expenses, creating a routine, reflecting on the stages of grief that were shared today. Continuing to speed time with others that are not a sexual connection Referral(s): Integrated [employer] (In Clinic) and financial counselor (orange card packet) "From scale of 1-10, how likely are you to follow plan?": agreed to the plan    11/09/2021    4:35 PM 11/09/2021    4:34 PM 09/21/2021   10:02 AM  PHQ-SADS  Last 3 Score only  Total GAD-7 Score  9 13  PHQ Adolescent Score 4      Shann Medal, LCSWA

## 2021-11-09 NOTE — Addendum Note (Signed)
Addended by: Karle Plumber B on: 11/09/2021 09:27 PM   Modules accepted: Orders

## 2021-11-09 NOTE — Telephone Encounter (Signed)
Good afternoon, met with patient today. He has stopped taking the Zoloft he stated that it was giving him sided effects he doesn't like such as feeling loopy. Patient is interested in taking something different so see how it has worked for him before his next appointment with you. Patient is having anxiety, heat palpations, pacing back and forth, over thinking a lot more lately, symptoms of panic attacks and lack of sleep. Please consider writing him a prescription for something other than Zoloft he asked. He also stated that his blood pressure medication will be out in 2 days and he does not have a refill. Can you please assist.

## 2021-11-10 ENCOUNTER — Other Ambulatory Visit: Payer: Self-pay

## 2021-11-11 NOTE — Telephone Encounter (Signed)
Yes when I meet with him again I will try to get him connected. He didn't seem to interested at the time of our visit. Thank you!  Also do you know if he had refills on his blood pressure medication? I know he was concerned about that.

## 2021-11-12 NOTE — Telephone Encounter (Signed)
Pt was called and informed of medication being changed and sent to pharmacy.

## 2021-11-16 ENCOUNTER — Other Ambulatory Visit: Payer: Self-pay

## 2021-11-27 ENCOUNTER — Other Ambulatory Visit: Payer: Self-pay

## 2021-11-27 ENCOUNTER — Ambulatory Visit: Payer: Self-pay | Admitting: Internal Medicine

## 2021-12-01 NOTE — Progress Notes (Deleted)
Psychiatric Initial Adult Assessment  Patient Identification: Danny Bishop MRN:  [id number] Date of Evaluation:  12/01/2021 Referral Source: ***  Assessment:  Danny Bishop is a 31 y.o. male with a history of anxiety and panic attacks who presents in person to [facility] for initial evaluation of anxiety and depression.  Patient reports ***  Plan:  # *** Past medication trials:  Status of problem: *** Interventions: -- ***  # *** Past medication trials:  Status of problem: *** Interventions: -- ***  # *** Past medication trials:  Status of problem: *** Interventions: -- ***  Patient was given contact information for behavioral health clinic and was instructed to call 911 for emergencies.   Subjective:  Chief Complaint: No chief complaint on file.   History of Present Illness:  ***  Past Psychiatric History:  Diagnoses: *** Medication trials: *** Previous psychiatrist/therapist: *** Hospitalizations: *** Suicide attempts: *** SIB: *** Hx of violence towards others: *** Current access to guns: *** Hx of abuse: ***  Substance Abuse History in the last 12 months:  {yes no:314532}  Past Medical History:  Past Medical History:  Diagnosis Date  . Anxiety   . Hypertension   . Sickle cell trait [facility])     Past Surgical History:  Procedure Laterality Date  . HAND SURGERY Right 2016   due to fracture involving index MCP    Family Psychiatric History: ***  Family History:  Family History  Problem Relation Age of Onset  . Sickle cell trait Father   . CAD Brother   . Sickle cell trait Brother     Social History:   Social History   Socioeconomic History  . Marital status: Single    Spouse name: Not on file  . Number of children: 5  . Years of education: Not on file  . Highest education level: Not on file  Occupational History  . Occupation: Glass blower/designer  Tobacco Use  . Smoking status: Every Day    Packs/day: 0.50     Years: 10.00    Total pack years: 5.00    Types: Cigarettes  . Smokeless tobacco: Never  Vaping Use  . Vaping Use: Never used  Substance and Sexual Activity  . Alcohol use: Yes    Alcohol/week: 7.0 standard drinks of alcohol    Types: 7 Cans of beer per week    Comment: 12 oz  . Drug use: Not Currently    Types: Marijuana  . Sexual activity: Not on file  Other Topics Concern  . Not on file  Social History Narrative  . Not on file   Social Determinants of Health   Financial Resource Strain: Not on file  Food Insecurity: Not on file  Transportation Needs: Not on file  Physical Activity: Not on file  Stress: Not on file  Social Connections: Not on file    Additional Social History: ***  Allergies:   Allergies  Allergen Reactions  . Eggs Or Egg-Derived Products Nausea And Vomiting    Current Medications: Current Outpatient Medications  Medication Sig Dispense Refill  . amLODipine (NORVASC) 5 MG tablet Take 1 tablet (5 mg total) by mouth daily. 30 tablet 5  . busPIRone (BUSPAR) 5 MG tablet Take 1 tablet (5 mg total) by mouth 2 (two) times daily. 60 tablet 1  . hydrOXYzine (ATARAX) 25 MG tablet Take 1 tablet (25 mg total) by mouth 2 (two) times daily as needed for anxiety. 60 tablet 1   No current  facility-administered medications for this visit.    ROS: Review of Systems  Objective:  Psychiatric Specialty Exam: There were no vitals taken for this visit.There is no height or weight on file to calculate BMI.  General Appearance: {Appearance:22683}  Eye Contact:  {BHH EYE CONTACT:22684}  Speech:  {Speech:22685}  Volume:  {Volume (PAA):22686}  Mood:  {BHH MOOD:22306}  Affect:  {Affect (PAA):22687}  Thought Content: {Thought Content:22690}   Suicidal Thoughts:  {ST/HT (PAA):22692}  Homicidal Thoughts:  {ST/HT (PAA):22692}  Thought Process:  {Thought Process (PAA):22688}  Orientation:  {BHH ORIENTATION (PAA):22689}    Memory:  {BHH MEMORY:22881}  Judgment:   {Judgement (PAA):22694}  Insight:  {Insight (PAA):22695}  Concentration:  {Concentration:21399}  Recall:  {BHH GOOD/FAIR/POOR:22877}  Fund of Knowledge: {BHH GOOD/FAIR/POOR:22877}  Language: {BHH GOOD/FAIR/POOR:22877}  Psychomotor Activity:  {Psychomotor (PAA):22696}  Akathisia:  {BHH YES OR NO:22294}  AIMS (if indicated): {Desc; done/not:10129}  Assets:  {Assets (PAA):22698}  ADL's:  {BHH ZJI'R:67893}  Cognition: {chl bhh cognition:304700322}  Sleep:  {BHH GOOD/FAIR/POOR:22877}   PE: General: well-appearing; no acute distress *** Pulm: no increased work of breathing on room air *** Strength & Muscle Tone: {desc; muscle tone:32375} Neuro: no focal neurological deficits observed *** Gait & Station: {PE GAIT ED NATL:22525}  Metabolic Disorder Labs: No results found for: "HGBA1C", "MPG" No results found for: "PROLACTIN" No results found for: "CHOL", "TRIG", "HDL", "CHOLHDL", "VLDL", "LDLCALC" Lab Results  Component Value Date   TSH 0.656 10/14/2021    Therapeutic Level Labs: No results found for: "LITHIUM" No results found for: "CBMZ" No results found for: "VALPROATE"  Screenings:  GAD-7    Flowsheet Row Integrated Behavioral Health from 11/09/2021 in [facility] Office Visit from 09/21/2021 in [facility]  Total GAD-7 Score 9 13      PHQ2-9    Flowsheet Row Integrated Behavioral Health from 11/09/2021 in [facility] Office Visit from 09/21/2021 in [facility] Office Visit from 04/16/2011 in [facility]  PHQ-2 Total Score 2 1 1   PHQ-9 Total Score 4 4 --      Flowsheet Row ED from 10/14/2021 in [facility] DE[facility] ED from 08/10/2021 in [facility] from 07/14/2021 in [facility] EMERGENCY DEPARTMENT  C-SSRS RISK CATEGORY No Risk No Risk No Risk        Collaboration of Care: Collaboration of Care: [facility] Collaboration of Care:[id number]}  Patient/Guardian was advised Release of Information must be obtained prior to any record release in order to collaborate their care with an outside provider. Patient/Guardian was advised if they have not already done so to contact the registration department to sign all necessary forms in order for [facility] to release information regarding their care.   Consent: Patient/Guardian gives verbal consent for treatment and assignment of benefits for services provided during this visit. Patient/Guardian expressed understanding and agreed to proceed.   A total of *** minutes was spent involved in face to face clinical care, chart review, documentation, and ***.   [place], MD 10/17/20231:56 PM

## 2021-12-03 ENCOUNTER — Ambulatory Visit (HOSPITAL_COMMUNITY): Payer: Self-pay | Admitting: Student

## 2021-12-04 ENCOUNTER — Other Ambulatory Visit: Payer: Self-pay

## 2021-12-14 ENCOUNTER — Encounter: Payer: Self-pay | Admitting: Licensed Clinical Social Worker

## 2021-12-14 ENCOUNTER — Ambulatory Visit: Payer: Self-pay | Admitting: Internal Medicine

## 2021-12-29 ENCOUNTER — Emergency Department (HOSPITAL_COMMUNITY)
Admission: EM | Admit: 2021-12-29 | Discharge: 2021-12-29 | Discharge status: Against Medical Advice | Payer: Self-pay | Source: Home / Self Care

## 2022-01-04 ENCOUNTER — Ambulatory Visit: Payer: Self-pay | Admitting: Internal Medicine

## 2022-01-06 ENCOUNTER — Emergency Department (HOSPITAL_COMMUNITY)
Admission: EM | Admit: 2022-01-06 | Discharge: 2022-01-06 | Disposition: A | Discharge status: Home / Self Care | Payer: Self-pay | Attending: Emergency Medicine | Admitting: Emergency Medicine

## 2022-01-06 ENCOUNTER — Telehealth: Payer: Self-pay | Admitting: Internal Medicine

## 2022-01-06 ENCOUNTER — Other Ambulatory Visit: Payer: Self-pay

## 2022-01-06 ENCOUNTER — Emergency Department (HOSPITAL_COMMUNITY): Payer: Medicaid Other

## 2022-01-06 DIAGNOSIS — I159 Secondary hypertension, unspecified: Secondary | ICD-10-CM | POA: Insufficient documentation

## 2022-01-06 DIAGNOSIS — Z79899 Other long term (current) drug therapy: Secondary | ICD-10-CM | POA: Insufficient documentation

## 2022-01-06 DIAGNOSIS — R002 Palpitations: Secondary | ICD-10-CM | POA: Insufficient documentation

## 2022-01-06 LAB — CBC
HCT: 42.5 % (ref 39.0–52.0)
Hemoglobin: 14.8 g/dL (ref 13.0–17.0)
MCH: 31.8 pg (ref 26.0–34.0)
MCHC: 34.8 g/dL (ref 30.0–36.0)
MCV: 91.4 fL (ref 80.0–100.0)
Platelets: 294 10*3/uL (ref 150–400)
RBC: 4.65 MIL/uL (ref 4.22–5.81)
RDW: 12.5 % (ref 11.5–15.5)
WBC: 4.7 10*3/uL (ref 4.0–10.5)
nRBC: 0 % (ref 0.0–0.2)

## 2022-01-06 LAB — TSH: TSH: 0.61 u[IU]/mL (ref 0.350–4.500)

## 2022-01-06 LAB — BASIC METABOLIC PANEL
Anion gap: 5 (ref 5–15)
BUN: 13 mg/dL (ref 6–20)
CO2: 27 mmol/L (ref 22–32)
Calcium: 9.3 mg/dL (ref 8.9–10.3)
Chloride: 109 mmol/L (ref 98–111)
Creatinine, Ser: 0.82 mg/dL (ref 0.61–1.24)
GFR, Estimated: >60 mL/min (ref >60)
Glucose, Bld: 96 mg/dL (ref 70–99)
Potassium: 4.4 mmol/L (ref 3.5–5.1)
Sodium: 141 mmol/L (ref 135–145)

## 2022-01-06 LAB — MAGNESIUM: Magnesium: 2.3 mg/dL (ref 1.7–2.4)

## 2022-01-06 NOTE — ED Provider Notes (Signed)
[facility]-EMERGENCY DEPT Provider Note   CSN: [id number] Arrival date & time: 01/06/22  3428     History  Chief Complaint  Patient presents with   Palpitations    Danny Bishop is a 31 y.o. male.  31 year old male with with history of anxiety presents to the emergency department palpitations.  Says that he had 2 episodes of palpitations this morning lasting several seconds.  Felt dizzy during it but did not pass out.  Unsure of any family history of sudden cardiac death or arrhythmias.  Does drink caffeinated sodas and had coffee yesterday but no coffee today.  No heavy alcohol use recently.  Smokes marijuana but last intake was 1 week ago and denies other illicit substance use.  No chest pain or shortness of breath.  Is recovering from COVID which she was diagnosed with last week.       Home Medications Prior to Admission medications   Medication Sig Start Date End Date Taking? Authorizing Provider  amLODipine (NORVASC) 5 MG tablet Take 1 tablet (5 mg total) by mouth daily. 11/09/21   Marcine Matar, MD  busPIRone (BUSPAR) 5 MG tablet Take 1 tablet (5 mg total) by mouth 2 (two) times daily. 11/09/21   Marcine Matar, MD  hydrOXYzine (ATARAX) 25 MG tablet Take 1 tablet (25 mg total) by mouth 2 (two) times daily as needed for anxiety. 09/21/21   Marcine Matar, MD      Allergies    Eggs or egg-derived products    Review of Systems   Review of Systems  Physical Exam Updated Vital Signs BP (!) 131/90 (BP Location: Right Arm)   Pulse 71   Temp 98.1 F (36.7 C) (Oral)   Resp 15   Ht 6' (1.829 m)   Wt 80 kg   SpO2 100%   BMI 23.92 kg/m  Physical Exam Vitals and nursing note reviewed.  Constitutional:      General: He is not in acute distress.    Appearance: He is well-developed.  HENT:     Head: Normocephalic and atraumatic.     Right Ear: External ear normal.     Left Ear: External ear normal.     Nose: Nose normal.  Eyes:      Extraocular Movements: Extraocular movements intact.     Conjunctiva/sclera: Conjunctivae normal.     Pupils: Pupils are equal, round, and reactive to light.  Cardiovascular:     Rate and Rhythm: Normal rate and regular rhythm.     Heart sounds: Normal heart sounds.  Pulmonary:     Effort: Pulmonary effort is normal. No respiratory distress.     Breath sounds: Normal breath sounds.  Musculoskeletal:     Cervical back: Normal range of motion and neck supple.     Right lower leg: No edema.     Left lower leg: No edema.  Skin:    General: Skin is warm and dry.     Capillary Refill: Capillary refill takes less than 2 seconds.  Neurological:     Mental Status: He is alert. Mental status is at baseline.  Psychiatric:        Mood and Affect: Mood normal.        Behavior: Behavior normal.     ED Results / Procedures / Treatments   Labs (all labs ordered are listed, but only abnormal results are displayed) Labs Reviewed  BASIC METABOLIC PANEL  CBC  MAGNESIUM  TSH    EKG  None  Radiology DG Chest Portable 1 View  Result Date: 01/06/2022 CLINICAL DATA:  31 year old male with palpitations. Recently positive for COVID-19. EXAM: PORTABLE CHEST 1 VIEW COMPARISON:  Chest CTA 08/10/2021 and earlier. FINDINGS: Portable AP semi upright view at 0858 hours. Lung volumes and mediastinal contours are normal. Visualized tracheal air column is within normal limits. Allowing for portable technique the lungs are clear. No pneumothorax or pleural effusion. Negative visible bowel gas and osseous structures. IMPRESSION: Negative portable chest. Electronically Signed   By: Odessa Fleming M.D.   On: 01/06/2022 09:05    Procedures Procedures   Medications Ordered in ED Medications - No data to display  ED Course/ Medical Decision Making/ A&P                           Medical Decision Making Amount and/or Complexity of Data Reviewed Labs: ordered. Radiology: ordered.   Danny Bishop is a 31 y.o.  male with comorbidities that complicate the patient evaluation including anxiety who presents with chief complaint of palpitations.  Initial Ddx:  Paroxysmal arrhythmia, PVCs, electrolyte abnormality, hyperthyroidism, anxiety  MDM:  Feel the patient is likely having a benign paroxysmal arrhythmia such as PVCs.  With his recent COVID diagnosis could be the inciting factor.  We will check his electrolytes and thyroid function as well.  If negative feel that he can follow-up with cardiology as an outpatient.  Plan:  Labs TSH EKG Chest x-ray  ED Summary/Re-evaluation:  Patient reevaluated and was stable.  Telemetry reviewed without any concerning findings.  EKG did show LVH with possible strain pattern without any other signs of arrhythmia.  Chest x-ray and lab work are reassuring.  As such feel that patient is suitable for outpatient work-up.  Cardiology referral was placed.  Also will have the patient follow-up with his primary doctor.  States that he has a refill of his amlodipine that he will pick up.   This patient presents to the ED for concern of complaints listed in HPI, this involves an extensive number of treatment options, and is a complaint that carries with it a high risk of complications and morbidity. Disposition including potential need for admission considered.   Dispo: DC Home. Return precautions discussed including, but not limited to, those listed in the AVS. Allowed pt time to ask questions which were answered fully prior to dc.  Records reviewed ED Visit Notes The following labs were independently interpreted: Chemistry and show no acute abnormality I independently reviewed the following imaging with scope of interpretation limited to determining acute life threatening conditions related to emergency care: Chest x-ray, which revealed no acute abnormality  I personally reviewed and interpreted cardiac monitoring: normal sinus rhythm  I personally reviewed and interpreted  the pt's EKG: see above for interpretation  I have reviewed the patients home medications and made adjustments as needed  Final Clinical Impression(s) / ED Diagnoses Final diagnoses:  Palpitations  Secondary hypertension    Rx / DC Orders ED Discharge Orders          Ordered    Ambulatory referral to Cardiology       Comments: Palpitations - referral for possible OP monitoring   01/06/22 1029              Rondel Baton, MD 01/06/22 [phone]

## 2022-01-06 NOTE — ED Triage Notes (Addendum)
C/o heart palpations this am when walking x 6-7 seconds. Denies sob and chest pain.  Pt reports 3-4 days of indigestion.  Hx of anxiety and HTN and off HTN meds x1 month Reports covid 1 week ago.

## 2022-01-06 NOTE — Discharge Instructions (Signed)
You were seen for your palpitations (racing heart beat).   Follow-up with your primary doctor in 2-3 days regarding your visit.  Cardiology will call you about an appointment and setup for an outpatient monitor.   Return immediately to the emergency department if you experience any of the following: fainting, chest pain, or any other concerning symptoms.    Thank you for visiting our Emergency Department. It was a pleasure taking care of you today.

## 2022-01-06 NOTE — Telephone Encounter (Signed)
A user error has taken place: encounter opened in error, closed for administrative reasons.

## 2022-01-15 ENCOUNTER — Emergency Department (HOSPITAL_COMMUNITY)
Admission: EM | Admit: 2022-01-15 | Discharge: 2022-01-15 | Discharge status: Against Medical Advice | Payer: Medicaid Other | Attending: Emergency Medicine | Admitting: Emergency Medicine

## 2022-01-15 ENCOUNTER — Other Ambulatory Visit: Payer: Self-pay

## 2022-01-15 ENCOUNTER — Encounter (HOSPITAL_COMMUNITY): Payer: Self-pay

## 2022-01-15 DIAGNOSIS — R002 Palpitations: Secondary | ICD-10-CM | POA: Insufficient documentation

## 2022-01-15 DIAGNOSIS — Z5321 Procedure and treatment not carried out due to patient leaving prior to being seen by health care provider: Secondary | ICD-10-CM | POA: Insufficient documentation

## 2022-01-15 DIAGNOSIS — R0602 Shortness of breath: Secondary | ICD-10-CM | POA: Insufficient documentation

## 2022-01-15 LAB — BASIC METABOLIC PANEL
Anion gap: 11 (ref 5–15)
BUN: 12 mg/dL (ref 6–20)
CO2: 26 mmol/L (ref 22–32)
Calcium: 9.7 mg/dL (ref 8.9–10.3)
Chloride: 106 mmol/L (ref 98–111)
Creatinine, Ser: 0.74 mg/dL (ref 0.61–1.24)
GFR, Estimated: >60 mL/min (ref >60)
Glucose, Bld: 93 mg/dL (ref 70–99)
Potassium: 3.9 mmol/L (ref 3.5–5.1)
Sodium: 143 mmol/L (ref 135–145)

## 2022-01-15 LAB — CBC WITH DIFFERENTIAL/PLATELET
Abs Immature Granulocytes: 0.02 10*3/uL (ref 0.00–0.07)
Basophils Absolute: 0 10*3/uL (ref 0.0–0.1)
Basophils Relative: 0 %
Eosinophils Absolute: 0 10*3/uL (ref 0.0–0.5)
Eosinophils Relative: 0 %
HCT: 39.7 % (ref 39.0–52.0)
Hemoglobin: 14 g/dL (ref 13.0–17.0)
Immature Granulocytes: 0 %
Lymphocytes Relative: 39 %
Lymphs Abs: 2.1 10*3/uL (ref 0.7–4.0)
MCH: 32 pg (ref 26.0–34.0)
MCHC: 35.3 g/dL (ref 30.0–36.0)
MCV: 90.8 fL (ref 80.0–100.0)
Monocytes Absolute: 0.6 10*3/uL (ref 0.1–1.0)
Monocytes Relative: 11 %
Neutro Abs: 2.7 10*3/uL (ref 1.7–7.7)
Neutrophils Relative %: 50 %
Platelets: 314 10*3/uL (ref 150–400)
RBC: 4.37 MIL/uL (ref 4.22–5.81)
RDW: 12.5 % (ref 11.5–15.5)
WBC: 5.4 10*3/uL (ref 4.0–10.5)
nRBC: 0 % (ref 0.0–0.2)

## 2022-01-15 NOTE — ED Provider Triage Note (Signed)
Emergency Medicine Provider Triage Evaluation Note  Danny Bishop , a 31 y.o. male  was evaluated in triage.  Pt complains of heart palpitations.  This has been going on for several months now.  States he started feeling sensation again at work today.  Feels like his heart is beating really fast.  Also endorses shortness of breath.  Denies chest pain.  Does endorse sensation of near syncope with these episodes.  States "I am certain I am going to die from this".  Was recently evaluated for this complaint.  Plan is to follow-up with cardiologist on December 14.  Patient is worried that something serious will happen before then that his evaluation today in the ED.  Review of Systems  Positive: See above Negative: See above  Physical Exam  BP (!) 161/105 (BP Location: Right Arm)   Pulse 98   Temp 98.6 F (37 C)   Resp 18   Ht 6' (1.829 m)   Wt 77.1 kg   SpO2 100%   BMI 23.06 kg/m  Gen:   Awake, no distress   Resp:  Normal effort  MSK:   Moves extremities without difficulty  Other:    Medical Decision Making  Medically screening exam initiated at 11:49 AM.  Appropriate orders placed.  Danny Bishop was informed that the remainder of the evaluation will be completed by another provider, this initial triage assessment does not replace that evaluation, and the importance of remaining in the ED until their evaluation is complete.  Work up started   Gareth Eagle, PA-C 01/15/22 1157

## 2022-01-15 NOTE — ED Notes (Signed)
Pt stated he was going to leave but came back in and stated that he was going to stay.

## 2022-01-15 NOTE — ED Triage Notes (Signed)
Pt arrived POV from home c/o palpations and SHOB. Pt states he was at work and unable to catch his breath. Pt states this has been going on for several months and nobody can figure out what is wrong. Pt states he has an appointment with a specialist on the 14th but couldn't wait.

## 2022-01-28 ENCOUNTER — Ambulatory Visit (INDEPENDENT_AMBULATORY_CARE_PROVIDER_SITE_OTHER): Payer: Self-pay

## 2022-01-28 ENCOUNTER — Ambulatory Visit: Payer: Self-pay | Attending: Cardiology | Admitting: Cardiology

## 2022-01-28 ENCOUNTER — Encounter: Payer: Self-pay | Admitting: Cardiology

## 2022-01-28 ENCOUNTER — Other Ambulatory Visit: Payer: Self-pay

## 2022-01-28 VITALS — BP 130/90 | HR 97 | Ht 72.0 in | Wt 181.0 lb

## 2022-01-28 DIAGNOSIS — R002 Palpitations: Secondary | ICD-10-CM

## 2022-01-28 DIAGNOSIS — R079 Chest pain, unspecified: Secondary | ICD-10-CM

## 2022-01-28 NOTE — Progress Notes (Signed)
Cardiology Office Note:    Date:  01/28/2022   ID:  Danny Bishop, DOB November 11, 1990, MRN [id number]  PCP:  Marcine Matar, MD   [facility] Providers Cardiologist:  Donato Schultz, MD     Referring MD: Rondel Baton, MD    History of Present Illness:    Danny Bishop is a 31 y.o. male here for the evaluation of palpitations at the request of Dr. Rolly Pancake.  He was in the emergency department last on 01/06/2022.  Notes reviewed.  He has been anxious about this.  Been to [facility] and [facility] ER total 5 times. Feels a stop for a second. Miss a pattern. Good days a bad days. Brother died 44 with sudden death. He has had a couple episodes of palpitations lasting several seconds, felt dizzy but did not pass out.  Sinus tachycardia 102 with no other abnormalities on ECG from 01/18/2022 personally reviewed and interpreted  Past Medical History:  Diagnosis Date   Anxiety    Hypertension    Palpitations    Sickle cell trait (HCC)     Past Surgical History:  Procedure Laterality Date   HAND SURGERY Right 2016   due to fracture involving index MCP    Current Medications: Current Meds  Medication Sig   amLODipine (NORVASC) 5 MG tablet Take 1 tablet (5 mg total) by mouth daily.   hydrOXYzine (ATARAX) 25 MG tablet Take 1 tablet (25 mg total) by mouth 2 (two) times daily as needed for anxiety.     Allergies:   Eggs or egg-derived products   Social History   Socioeconomic History   Marital status: Single    Spouse name: Not on file   Number of children: 5   Years of education: Not on file   Highest education level: Not on file  Occupational History   Occupation: Location manager  Tobacco Use   Smoking status: Every Day    Packs/day: 0.50    Years: 10.00    Total pack years: 5.00    Types: Cigarettes   Smokeless tobacco: Never  Vaping Use   Vaping Use: Never used  Substance and Sexual Activity   Alcohol use: Yes    Alcohol/week: 7.0 standard drinks of  alcohol    Types: 7 Cans of beer per week    Comment: 12 oz   Drug use: Not Currently    Types: Marijuana   Sexual activity: Not on file  Other Topics Concern   Not on file  Social History Narrative   Not on file   Social Determinants of Health   Financial Resource Strain: Not on file  Food Insecurity: Not on file  Transportation Needs: Not on file  Physical Activity: Not on file  Stress: Not on file  Social Connections: Not on file     Family History: The patient's family history includes CAD in his brother; Sickle cell trait in his brother and father.  ROS:   Please see the history of present illness.     All other systems reviewed and are negative.  EKGs/Labs/Other Studies Reviewed:    The following studies were reviewed today: ER notes reviewed lab work reviewed  EKG:  The ekg ordered today demonstrates EKG normal from above  Recent Labs: 10/14/2021: ALT 29 01/06/2022: Magnesium 2.3; TSH 0.610 01/15/2022: BUN 12; Creatinine, Ser 0.74; Hemoglobin 14.0; Platelets 314; Potassium 3.9; Sodium 143  Recent Lipid Panel No results found for: "CHOL", "TRIG", "HDL", "CHOLHDL", "VLDL", "LDLCALC", "LDLDIRECT"  Risk Assessment/Calculations:              Physical Exam:    VS:  BP (!) 130/90 (BP Location: Left Arm, Patient Position: Sitting, Cuff Size: Normal)   Pulse 97   Ht 6' (1.829 m)   Wt 181 lb (82.1 kg)   SpO2 98%   BMI 24.55 kg/m     Wt Readings from Last 3 Encounters:  01/28/22 181 lb (82.1 kg)  01/15/22 170 lb (77.1 kg)  01/06/22 176 lb 5.9 oz (80 kg)     GEN:  Well nourished, well developed in no acute distress HEENT: Normal NECK: No JVD; No carotid bruits LYMPHATICS: No lymphadenopathy CARDIAC: RRR, no murmurs, rubs, gallops RESPIRATORY:  Clear to auscultation without rales, wheezing or rhonchi  ABDOMEN: Soft, non-tender, non-distended MUSCULOSKELETAL:  No edema; No deformity  SKIN: Warm and dry NEUROLOGIC:  Alert and oriented x  3 PSYCHIATRIC:  Normal affect   ASSESSMENT:    1. Palpitations   2. Chest pain, unspecified type    PLAN:    In order of problems listed above:  Atypical chest pain We will check an echocardiogram to ensure proper structure and function of his heart.  This is especially important since his brother in his 30s was found dead at home by his other brother.  Autopsy stated that he had a heart attack of some type, possibly sudden death.  I want to make sure that he does not have any evidence of hypertrophic cardiomyopathy.  Palpitations He is very sensitive to palpitations, skips, subtle pauses.  These could all be PVCs.  We will go ahead and check a Zio patch monitor.  I have described these sensations to him and the physiology behind PVCs.          Medication Adjustments/Labs and Tests Ordered: Current medicines are reviewed at length with the patient today.  Concerns regarding medicines are outlined above.  Orders Placed This Encounter  Procedures   LONG TERM MONITOR (3-14 DAYS)   ECHOCARDIOGRAM COMPLETE   No orders of the defined types were placed in this encounter.   Patient Instructions  Medication Instructions:  The current medical regimen is effective;  continue present plan and medications.  *If you need a refill on your cardiac medications before your next appointment, please call your pharmacy*   Testing/Procedures: Your physician has requested that you have an echocardiogram. Echocardiography is a painless test that uses sound waves to create images of your heart. It provides your doctor with information about the size and shape of your heart and how well your heart's chambers and valves are working. This procedure takes approximately one hour. There are no restrictions for this procedure. Please do NOT wear cologne, perfume, aftershave, or lotions (deodorant is allowed). Please arrive 15 minutes prior to your appointment time.  ZIO XT- Long Term Monitor  Instructions  Your physician has requested you wear a ZIO patch monitor for 14 days.  This is a single patch monitor. [employer] supplies one patch monitor per enrollment. Additional stickers are not available. Please do not apply patch if you will be having a Nuclear Stress Test,  Echocardiogram, Cardiac CT, MRI, or Chest Xray during the period you would be wearing the  monitor. The patch cannot be worn during these tests. You cannot remove and re-apply the  ZIO XT patch monitor.  Your ZIO patch monitor will be mailed 3 day USPS to your address on file. It may take 3-5 days  to receive your  monitor after you have been enrolled.  Once you have received your monitor, please review the enclosed instructions. Your monitor  has already been registered assigning a specific monitor serial # to you.  Billing and Patient Assistance Program Information  We have supplied [employer] with any of your insurance information on file for billing purposes. [employer] offers a sliding scale Patient Assistance Program for patients that do not have  insurance, or whose insurance does not completely cover the cost of the ZIO monitor.  You must apply for the Patient Assistance Program to qualify for this discounted rate.  To apply, please call [employer] at [phone], select option 4, select option 2, ask to apply for  Patient Assistance Program. [employer] will ask your household income, and how many people  are in your household. They will quote your out-of-pocket cost based on that information.  [employer] will also be able to set up a 439-month, interest-free payment plan if needed.  Applying the monitor   Shave hair from upper left chest.  Hold abrader disc by orange tab. Rub abrader in 40 strokes over the upper left chest as  indicated in your monitor instructions.  Clean area with 4 enclosed alcohol pads. Let dry.  Apply patch as indicated in monitor instructions. Patch will be placed under collarbone on left  side  of chest with arrow pointing upward.  Rub patch adhesive wings for 2 minutes. Remove white label marked "1". Remove the white  label marked "2". Rub patch adhesive wings for 2 additional minutes.  While looking in a mirror, press and release button in center of patch. A small green light will  flash 3-4 times. This will be your only indicator that the monitor has been turned on.  Do not shower for the first 24 hours. You may shower after the first 24 hours.  Press the button if you feel a symptom. You will hear a small click. Record Date, Time and  Symptom in the Patient Logbook.  When you are ready to remove the patch, follow instructions on the last 2 pages of Patient  Logbook. Stick patch monitor onto the last page of Patient Logbook.  Place Patient Logbook in the blue and white box. Use locking tab on box and tape box closed  securely. The blue and white box has prepaid postage on it. Please place it in the mailbox as  soon as possible. Your physician should have your test results approximately 7 days after the  monitor has been [facility].  Call [facility] Customer Care at [phone] if you have questions regarding  your ZIO XT patch monitor. Call them immediately if you see an orange light blinking on your  monitor.  If your monitor falls off in less than 4 days, contact our Monitor department at [phone].  If your monitor becomes loose or falls off after 4 days call [employer] at [phone] for  suggestions on securing your monitor   Follow-Up: At [facility], you and your health needs are our priority.  As part of our continuing mission to provide you with exceptional heart care, we have created designated Provider Care Teams.  These Care Teams include your primary Cardiologist (physician) and Advanced Practice Providers (APPs -  Physician Assistants and Nurse Practitioners) who all work together to provide you with the care you need, when you  need it.  We recommend signing up for the patient portal called "MyChart".  Sign up information is provided on this After Visit  Summary.  MyChart is used to connect with patients for Virtual Visits (Telemedicine).  Patients are able to view lab/test results, encounter notes, upcoming appointments, etc.  Non-urgent messages can be sent to your provider as well.   To learn more about what you can do with MyChart, go to [email or web address].    Your next appointment:   Follow up will be based on the results of the above testing.  Important Information About Sugar         Signed, Donato Schultz, MD  01/28/2022 4:43 PM    [facility]

## 2022-01-28 NOTE — Progress Notes (Unsigned)
Enrolled for [employer] to mail a ZIO XT long term holter monitor to the patients address on file.  

## 2022-01-28 NOTE — Patient Instructions (Signed)
Medication Instructions:  The current medical regimen is effective;  continue present plan and medications.  *If you need a refill on your cardiac medications before your next appointment, please call your pharmacy*   Testing/Procedures: Your physician has requested that you have an echocardiogram. Echocardiography is a painless test that uses sound waves to create images of your heart. It provides your doctor with information about the size and shape of your heart and how well your heart's chambers and valves are working. This procedure takes approximately one hour. There are no restrictions for this procedure. Please do NOT wear cologne, perfume, aftershave, or lotions (deodorant is allowed). Please arrive 15 minutes prior to your appointment time.  ZIO XT- Long Term Monitor Instructions  Your physician has requested you wear a ZIO patch monitor for 14 days.  This is a single patch monitor. [employer] supplies one patch monitor per enrollment. Additional stickers are not available. Please do not apply patch if you will be having a Nuclear Stress Test,  Echocardiogram, Cardiac CT, MRI, or Chest Xray during the period you would be wearing the  monitor. The patch cannot be worn during these tests. You cannot remove and re-apply the  ZIO XT patch monitor.  Your ZIO patch monitor will be mailed 3 day USPS to your address on file. It may take 3-5 days  to receive your monitor after you have been enrolled.  Once you have received your monitor, please review the enclosed instructions. Your monitor  has already been registered assigning a specific monitor serial # to you.  Billing and Patient Assistance Program Information  We have supplied [employer] with any of your insurance information on file for billing purposes. [employer] offers a sliding scale Patient Assistance Program for patients that do not have  insurance, or whose insurance does not completely cover the cost of the ZIO monitor.  You must  apply for the Patient Assistance Program to qualify for this discounted rate.  To apply, please call [employer] at [phone], select option 4, select option 2, ask to apply for  Patient Assistance Program. [employer] will ask your household income, and how many people  are in your household. They will quote your out-of-pocket cost based on that information.  [employer] will also be able to set up a 12-month, interest-free payment plan if needed.  Applying the monitor   Shave hair from upper left chest.  Hold abrader disc by orange tab. Rub abrader in 40 strokes over the upper left chest as  indicated in your monitor instructions.  Clean area with 4 enclosed alcohol pads. Let dry.  Apply patch as indicated in monitor instructions. Patch will be placed under collarbone on left  side of chest with arrow pointing upward.  Rub patch adhesive wings for 2 minutes. Remove white label marked "1". Remove the white  label marked "2". Rub patch adhesive wings for 2 additional minutes.  While looking in a mirror, press and release button in center of patch. A small green light will  flash 3-4 times. This will be your only indicator that the monitor has been turned on.  Do not shower for the first 24 hours. You may shower after the first 24 hours.  Press the button if you feel a symptom. You will hear a small click. Record Date, Time and  Symptom in the Patient Logbook.  When you are ready to remove the patch, follow instructions on the last 2 pages of Patient  Logbook. Stick patch monitor onto the last   page of Patient Logbook.  Place Patient Logbook in the blue and white box. Use locking tab on box and tape box closed  securely. The blue and white box has prepaid postage on it. Please place it in the mailbox as  soon as possible. Your physician should have your test results approximately 7 days after the  monitor has been mailed back to [employer].  Call [employer] Customer Care at [phone] if  you have questions regarding  your ZIO XT patch monitor. Call them immediately if you see an orange light blinking on your  monitor.  If your monitor falls off in less than 4 days, contact our Monitor department at [phone].  If your monitor becomes loose or falls off after 4 days call [employer] at [phone] for  suggestions on securing your monitor   Follow-Up: At [facility], you and your health needs are our priority.  As part of our continuing mission to provide you with exceptional heart care, we have created designated Provider Care Teams.  These Care Teams include your primary Cardiologist (physician) and Advanced Practice Providers (APPs -  Physician Assistants and Nurse Practitioners) who all work together to provide you with the care you need, when you need it.  We recommend signing up for the patient portal called "MyChart".  Sign up information is provided on this After Visit Summary.  MyChart is used to connect with patients for Virtual Visits (Telemedicine).  Patients are able to view lab/test results, encounter notes, upcoming appointments, etc.  Non-urgent messages can be sent to your provider as well.   To learn more about what you can do with MyChart, go to [email or web address].    Your next appointment:   Follow up will be based on the results of the above testing.  Important Information About Sugar       

## 2022-01-30 DIAGNOSIS — R002 Palpitations: Secondary | ICD-10-CM

## 2022-02-10 ENCOUNTER — Ambulatory Visit: Payer: Medicaid Other | Admitting: Physician Assistant

## 2022-02-22 ENCOUNTER — Telehealth: Payer: Self-pay | Admitting: Cardiology

## 2022-02-22 NOTE — Telephone Encounter (Signed)
Monitor was returned USPS not UPS.  [employer] has received your monitor on 02/19/2022 and  they are processing it now.  Once we have your report, Dr. Marlou Porch will review it and have Pam call you.

## 2022-02-22 NOTE — Telephone Encounter (Signed)
Pt states he returned his monitor back to our office and he spoke with [employer] and was told that the monitor hasn't been sent back yet, please advise.

## 2022-03-05 ENCOUNTER — Ambulatory Visit (HOSPITAL_COMMUNITY): Payer: No Typology Code available for payment source | Attending: Cardiovascular Disease

## 2022-03-05 DIAGNOSIS — R002 Palpitations: Secondary | ICD-10-CM | POA: Insufficient documentation

## 2022-03-05 DIAGNOSIS — R079 Chest pain, unspecified: Secondary | ICD-10-CM | POA: Insufficient documentation

## 2022-03-05 LAB — ECHOCARDIOGRAM COMPLETE
Area-P 1/2: 3.37 cm2
S' Lateral: 2.9 cm

## 2022-03-10 ENCOUNTER — Ambulatory Visit: Payer: No Typology Code available for payment source | Attending: Internal Medicine | Admitting: Physician Assistant

## 2022-04-01 ENCOUNTER — Other Ambulatory Visit: Payer: Self-pay

## 2022-04-01 ENCOUNTER — Other Ambulatory Visit: Payer: Self-pay | Admitting: *Deleted

## 2022-04-01 MED ORDER — METOPROLOL SUCCINATE ER 25 MG PO TB24
25.0000 mg | ORAL_TABLET | Freq: Every day | ORAL | 3 refills | Status: DC
  Filled 2022-04-01: qty 90, 90d supply, fill #0

## 2022-04-01 NOTE — Progress Notes (Signed)
    Predominant rhythm is sinus with average heart rate of 102 bpm.  During sleep, minimum heart rate was 55 bpm.    Rare episodes of atrial tachycardia noted.    There was a 9-second episode of wide-complex tachycardia with average heart rate of 135 bpm, slow VT.  There is no significant change in heart rate from narrow complex preceding onset and narrow complex preceding termination.    Rare PVCs and PACs.    No atrial fibrillation present.    Symptomatic episodes were associated with sinus tachycardia mostly in the 110 range.   If he would like, we can try low-dose Toprol 25 mg once a day.   Candee Furbish, MD   Pt aware of results and is willing to try taking Toprol XL 25 mg daily for symptom relief.  He will call back if any questions or concerns.  RX sent into [facility] as requested.

## 2022-04-02 ENCOUNTER — Encounter: Payer: Self-pay | Admitting: Urology

## 2022-04-02 ENCOUNTER — Other Ambulatory Visit: Payer: Self-pay

## 2022-04-02 ENCOUNTER — Ambulatory Visit (INDEPENDENT_AMBULATORY_CARE_PROVIDER_SITE_OTHER): Payer: No Typology Code available for payment source | Admitting: Urology

## 2022-04-02 VITALS — BP 156/116 | HR 89 | Ht 72.0 in | Wt 173.0 lb

## 2022-04-02 DIAGNOSIS — N3943 Post-void dribbling: Secondary | ICD-10-CM | POA: Insufficient documentation

## 2022-04-02 DIAGNOSIS — R339 Retention of urine, unspecified: Secondary | ICD-10-CM | POA: Insufficient documentation

## 2022-04-02 LAB — BLADDER SCAN AMB NON-IMAGING

## 2022-04-02 MED ORDER — ALFUZOSIN HCL ER 10 MG PO TB24: 10.0000 mg | ORAL_TABLET | Freq: Every day | ORAL | 11 refills | Status: DC

## 2022-04-02 NOTE — Progress Notes (Signed)
Assessment: 1. Post-void dribbling   2. Incomplete bladder emptying    Plan: I personally reviewed the patient's chart including provider notes, lab results, and imaging results. Records from both [facility] and [facility] reviewed today. Trial of alfuzosin 10 mg daily.  Rx sent. Bladder diet sheet provided Return to office in 1 month   Chief Complaint:  Chief Complaint  Patient presents with   Urinary Frequency    History of Present Illness:  Danny Bishop is a 32 y.o. male who is seen in consultation from Ladell Pier, MD for evaluation of lower urinary tract symptoms.  He reports urinary symptoms for a number of years.  He has a history of significant urinary frequency during the daytime.  He reports that the frequency has improved.  He currently voids every 2-4 hours.  He does have urgency.  No nocturia.  His main symptoms are a sensation of incomplete emptying, intermittent stream, and postvoid dribbling.  No dysuria or gross hematuria.  No recent UTIs or STDs. IPSS = 24 QOL = 6/6 today.  He has been evaluated multiple times in the emergency room and health department.  All prior STD testing was negative.  He was previously evaluated by Dr. Risa Grill in [place].  He underwent urodynamic evaluation in April 2012.  This study showed increased sensitivity of the bladder with mild instability occurring at a volume of 300 mL, good flow rate and adequate emptying.  He has tried alpha-blocker's and anticholinergics including the Vesicare, Enablex, and Toviaz without benefit.  He was evaluated further at [facility] in January 2013.  CT imaging was unremarkable.  Cystoscopy showed no urethral strictures, normal prostate, and normal bladder.  Past Medical History:  Past Medical History:  Diagnosis Date   Anxiety    Hypertension    Palpitations    Sickle cell trait ([place])     Past Surgical History:  Past Surgical History:  Procedure Laterality Date   HAND SURGERY  Right 2016   due to fracture involving index MCP    Allergies:  Allergies  Allergen Reactions   Eggs Or Egg-Derived Products Nausea And Vomiting    Family History:  Family History  Problem Relation Age of Onset   Sickle cell trait Father    CAD Brother    Sickle cell trait Brother     Social History:  Social History   Tobacco Use   Smoking status: Every Day    Packs/day: 0.50    Years: 10.00    Total pack years: 5.00    Types: Cigarettes   Smokeless tobacco: Never  Vaping Use   Vaping Use: Never used  Substance Use Topics   Alcohol use: Yes    Alcohol/week: 7.0 standard drinks of alcohol    Types: 7 Cans of beer per week    Comment: 12 oz   Drug use: Not Currently    Types: Marijuana    Review of symptoms:  Constitutional:  Negative for unexplained weight loss, night sweats, fever, chills ENT:  Negative for nose bleeds, sinus pain, painful swallowing CV:  Negative for chest pain, shortness of breath, exercise intolerance, palpitations, loss of consciousness Resp:  Negative for cough, wheezing, shortness of breath GI:  Negative for nausea, vomiting, diarrhea, bloody stools GU:  Positives noted in HPI; otherwise negative for gross hematuria, dysuria, urinary incontinence Neuro:  Negative for seizures, poor balance, limb weakness, slurred speech Psych:  Negative for lack of energy, depression, anxiety Endocrine:  Negative for polydipsia,  polyuria, symptoms of hypoglycemia (dizziness, hunger, sweating) Hematologic:  Negative for anemia, purpura, petechia, prolonged or excessive bleeding, use of anticoagulants  Allergic:  Negative for difficulty breathing or choking as a result of exposure to anything; no shellfish allergy; no allergic response (rash/itch) to materials, foods  Physical exam: BP (!) 156/116   Pulse 89   Ht 6' (1.829 m)   Wt 173 lb (78.5 kg)   BMI 23.46 kg/m  GENERAL APPEARANCE:  Well appearing, well developed, well nourished, NAD HEENT:  Atraumatic, Normocephalic, oropharynx clear. NECK: Supple without lymphadenopathy or thyromegaly. LUNGS: Clear to auscultation bilaterally. HEART: Regular Rate and Rhythm without murmurs, gallops, or rubs. ABDOMEN: Soft, non-tender, No Masses. EXTREMITIES: Moves all extremities well.  Without clubbing, cyanosis, or edema. NEUROLOGIC:  Alert and oriented x 3, normal gait, CN II-XII grossly intact.  MENTAL STATUS:  Appropriate. BACK:  Non-tender to palpation.  No CVAT SKIN:  Warm, dry and intact.   GU: Penis:  circumcised Meatus: Normal Scrotum: normal, no masses Testis: normal without masses bilateral Epididymis: normal  Results: U/A dipstick: 2+ protein  PVR = 91 ml

## 2022-05-03 ENCOUNTER — Ambulatory Visit (INDEPENDENT_AMBULATORY_CARE_PROVIDER_SITE_OTHER): Payer: No Typology Code available for payment source | Admitting: Urology

## 2022-05-03 ENCOUNTER — Encounter: Payer: Self-pay | Admitting: Urology

## 2022-05-03 VITALS — BP 149/97 | HR 76 | Ht 72.0 in | Wt 185.0 lb

## 2022-05-03 DIAGNOSIS — R339 Retention of urine, unspecified: Secondary | ICD-10-CM | POA: Diagnosis not present

## 2022-05-03 DIAGNOSIS — N3943 Post-void dribbling: Secondary | ICD-10-CM | POA: Diagnosis not present

## 2022-05-03 LAB — MICROSCOPIC EXAMINATION
Cast Type: NONE SEEN
Casts: NONE SEEN /lpf
Crystal Type: NONE SEEN
Crystals: NONE SEEN
Epithelial Cells (non renal): NONE SEEN /hpf (ref 0–10)
Renal Epithel, UA: NONE SEEN /hpf
Trichomonas, UA: NONE SEEN
Yeast, UA: NONE SEEN

## 2022-05-03 LAB — URINALYSIS, ROUTINE W REFLEX MICROSCOPIC
Bilirubin, UA: NEGATIVE
Glucose, UA: NEGATIVE
Ketones, UA: NEGATIVE
Leukocytes,UA: NEGATIVE
Nitrite, UA: NEGATIVE
Specific Gravity, UA: 1.02 (ref 1.005–1.030)
Urobilinogen, Ur: 0.2 mg/dL (ref 0.2–1.0)
pH, UA: 6 (ref 5.0–7.5)

## 2022-05-03 LAB — BLADDER SCAN AMB NON-IMAGING

## 2022-05-03 NOTE — Progress Notes (Signed)
Assessment: 1. Post-void dribbling   2. Incomplete bladder emptying     Plan: Continue alfuzosin Continue bladder diet  He is bothered by his voiding pattern and would like to proceed with repeat evaluation.  I recommended that we start with cystoscopy and consider the need for repeat urodynamics although both of these have been normal in the past.  He has essentially failed trials of medical management with alpha blockers and anticholinergics. Schedule for cystoscopy in the next 2-3 weeks.  Chief Complaint:  Chief Complaint  Patient presents with   post void dribbling    History of Present Illness:  Danny Bishop is a 32 y.o. male who is seen for further evaluation of lower urinary tract symptoms.  At his initial visit in 2/24, he reported urinary symptoms for a number of years.  He has a history of significant urinary frequency during the daytime.  He reported that the frequency had improved.  He currently voids every 2-4 hours.  He has urgency.  No nocturia.  His main symptoms are a sensation of incomplete emptying, intermittent stream, and postvoid dribbling.  No dysuria or gross hematuria.  No recent UTIs or STDs. IPSS = 24 QOL = 6/6. PVR = 91 ml.  He has been evaluated multiple times in the emergency room and health department.  All prior STD testing was negative.  He was previously evaluated by Dr. Risa Grill in [place].  He underwent urodynamic evaluation in April 2012.  This study showed increased sensitivity of the bladder with mild instability occurring at a volume of 300 mL, good flow rate and adequate emptying.  He has tried alpha-blocker's and anticholinergics including the Vesicare, Enablex, and Toviaz without benefit.  He was evaluated further at [facility] in January 2013.  CT imaging was unremarkable.  Cystoscopy showed no urethral strictures, normal prostate, and normal bladder.  He was given a trial of alfuzosin 10 mg daily in 2/24.  He was also given instructions on a  bladder diet.  He returns today for follow-up.  He continues on alfuzosin.  He has not noted any improvement in his urinary symptoms.  He continues to have a sensation of incomplete emptying and postvoid dribbling.  He reports that it takes approximately 4-5 minutes for him to try to empty his bladder completely.  He is not having any dysuria or gross hematuria. IPSS = 17 QOL = 6/6 today.  Portions of the above documentation were copied from a prior visit for review purposes only.   Past Medical History:  Past Medical History:  Diagnosis Date   Anxiety    Hypertension    Palpitations    Sickle cell trait ([place])     Past Surgical History:  Past Surgical History:  Procedure Laterality Date   HAND SURGERY Right 2016   due to fracture involving index MCP    Allergies:  Allergies  Allergen Reactions   Egg-Derived Products Nausea And Vomiting    Family History:  Family History  Problem Relation Age of Onset   Sickle cell trait Father    CAD Brother    Sickle cell trait Brother     Social History:  Social History   Tobacco Use   Smoking status: Every Day    Packs/day: 0.50    Years: 10.00    Additional pack years: 0.00    Total pack years: 5.00    Types: Cigarettes   Smokeless tobacco: Never  Vaping Use   Vaping Use: Never used  Substance Use  Topics   Alcohol use: Yes    Alcohol/week: 7.0 standard drinks of alcohol    Types: 7 Cans of beer per week    Comment: 12 oz   Drug use: Not Currently    Types: Marijuana    ROS: Constitutional:  Negative for fever, chills, weight loss CV: Negative for chest pain, previous MI, hypertension Respiratory:  Negative for shortness of breath, wheezing, sleep apnea, frequent cough GI:  Negative for nausea, vomiting, bloody stool, GERD  Physical exam: BP (!) 149/97   Pulse 76   Ht 6' (1.829 m)   Wt 185 lb (83.9 kg)   BMI 25.09 kg/m  GENERAL APPEARANCE:  Well appearing, well developed, well nourished, NAD HEENT:   Atraumatic, normocephalic, oropharynx clear NECK:  Supple without lymphadenopathy or thyromegaly ABDOMEN:  Soft, non-tender, no masses EXTREMITIES:  Moves all extremities well, without clubbing, cyanosis, or edema NEUROLOGIC:  Alert and oriented x 3, normal gait, CN II-XII grossly intact MENTAL STATUS:  appropriate BACK:  Non-tender to palpation, No CVAT SKIN:  Warm, dry, and intact  Results: U/A: 2+ protein, 0-5 WBC, 0-2 RBC  PVR:  22 ml

## 2022-05-09 ENCOUNTER — Encounter (HOSPITAL_COMMUNITY): Payer: Self-pay | Admitting: Emergency Medicine

## 2022-05-09 ENCOUNTER — Emergency Department (HOSPITAL_COMMUNITY): Payer: No Typology Code available for payment source

## 2022-05-09 ENCOUNTER — Emergency Department (HOSPITAL_COMMUNITY)
Admission: EM | Admit: 2022-05-09 | Discharge: 2022-05-09 | Disposition: A | Discharge status: Home / Self Care | Payer: No Typology Code available for payment source | Attending: Emergency Medicine | Admitting: Emergency Medicine

## 2022-05-09 ENCOUNTER — Other Ambulatory Visit: Payer: Self-pay

## 2022-05-09 DIAGNOSIS — Z79899 Other long term (current) drug therapy: Secondary | ICD-10-CM | POA: Insufficient documentation

## 2022-05-09 DIAGNOSIS — N451 Epididymitis: Secondary | ICD-10-CM

## 2022-05-09 DIAGNOSIS — N50819 Testicular pain, unspecified: Secondary | ICD-10-CM | POA: Diagnosis present

## 2022-05-09 DIAGNOSIS — I10 Essential (primary) hypertension: Secondary | ICD-10-CM | POA: Diagnosis not present

## 2022-05-09 LAB — URINALYSIS, ROUTINE W REFLEX MICROSCOPIC
Bacteria, UA: NONE SEEN
Bilirubin Urine: NEGATIVE
Glucose, UA: NEGATIVE mg/dL
Hgb urine dipstick: NEGATIVE
Ketones, ur: NEGATIVE mg/dL
Leukocytes,Ua: NEGATIVE
Nitrite: NEGATIVE
Protein, ur: 100 mg/dL — AB
Specific Gravity, Urine: 1.012 (ref 1.005–1.030)
pH: 6 (ref 5.0–8.0)

## 2022-05-09 MED ORDER — AZITHROMYCIN 250 MG PO TABS
1000.0000 mg | ORAL_TABLET | Freq: Once | ORAL | Status: AC
  Administered 2022-05-09: 1000 mg via ORAL
  Filled 2022-05-09: qty 4

## 2022-05-09 MED ORDER — CEFTRIAXONE SODIUM 500 MG IJ SOLR
500.0000 mg | Freq: Once | INTRAMUSCULAR | Status: AC
  Administered 2022-05-09: 500 mg via INTRAMUSCULAR
  Filled 2022-05-09: qty 500

## 2022-05-09 MED ORDER — CIPROFLOXACIN HCL 500 MG PO TABS
500.0000 mg | ORAL_TABLET | Freq: Two times a day (BID) | ORAL | 0 refills | Status: DC
  Filled 2022-05-09: qty 14, 7d supply, fill #0

## 2022-05-09 MED ORDER — LIDOCAINE HCL (PF) 1 % IJ SOLN
1.0000 mL | Freq: Once | INTRAMUSCULAR | Status: AC
  Administered 2022-05-09: 1 mL
  Filled 2022-05-09: qty 5

## 2022-05-09 NOTE — ED Provider Notes (Signed)
[facility] Provider Note   CSN: [id number] Arrival date & time: 05/09/22  1755     History  Chief Complaint  Patient presents with   Testicle Pain    Danny Bishop is a 32 y.o. male.  Pt is a 32 yo male with pmhx significant for htn, anxiety, and sickle cell trait.  Pt has been seeing urology for post-void dribbling and incomplete bladder emptying.  He just saw Dr. Felipa Eth on 3/18.  Pt is scheduled for a repeat cystoscopy, but his first one was normal.  Today, pt feels like he has some swelling and some pain in his testicles. He does have a little penile drainage, but this is a chronic thing.        Home Medications Prior to Admission medications   Medication Sig Start Date End Date Taking? Authorizing Provider  ciprofloxacin (CIPRO) 500 MG tablet Take 1 tablet (500 mg total) by mouth 2 (two) times daily. 05/09/22  Yes Isla Pence, MD  alfuzosin (UROXATRAL) 10 MG 24 hr tablet Take 1 tablet (10 mg total) by mouth daily. 04/02/22   Stoneking, Reece Leader., MD  amLODipine (NORVASC) 5 MG tablet Take 1 tablet (5 mg total) by mouth daily. 11/09/21   Ladell Pier, MD  hydrOXYzine (ATARAX) 25 MG tablet Take 1 tablet (25 mg total) by mouth 2 (two) times daily as needed for anxiety. 09/21/21   Ladell Pier, MD  metoprolol succinate (TOPROL-XL) 25 MG 24 hr tablet Take 1 tablet (25 mg total) by mouth daily. 04/01/22   Jerline Pain, MD      Allergies    Egg-derived products    Review of Systems   Review of Systems  Genitourinary:  Positive for scrotal swelling and testicular pain.    Physical Exam Updated Vital Signs BP (!) 169/102 (BP Location: Right Arm)   Pulse (!) 102   Temp 98.1 F (36.7 C) (Oral)   Resp 16   Ht 6' (1.829 m)   Wt 86.2 kg   SpO2 100%   BMI 25.77 kg/m  Physical Exam Vitals and nursing note reviewed. Exam conducted with a chaperone present.  Constitutional:      Appearance: Normal appearance.   HENT:     Head: Normocephalic and atraumatic.     Right Ear: External ear normal.     Left Ear: External ear normal.     Nose: Nose normal.     Mouth/Throat:     Mouth: Mucous membranes are moist.     Pharynx: Oropharynx is clear.  Eyes:     Extraocular Movements: Extraocular movements intact.     Conjunctiva/sclera: Conjunctivae normal.     Pupils: Pupils are equal, round, and reactive to light.  Cardiovascular:     Rate and Rhythm: Normal rate and regular rhythm.     Pulses: Normal pulses.     Heart sounds: Normal heart sounds.  Pulmonary:     Effort: Pulmonary effort is normal.     Breath sounds: Normal breath sounds.  Abdominal:     General: Abdomen is flat. Bowel sounds are normal.     Palpations: Abdomen is soft.  Genitourinary:    Penis: Normal and circumcised.      Testes: Normal.     Comments: I don't appreciate any swelling.  Pt said his tenderness is "inside."  He is not tender anywhere when I palpate. Musculoskeletal:        General: Normal range of motion.  Cervical back: Normal range of motion and neck supple.  Skin:    General: Skin is warm.     Capillary Refill: Capillary refill takes less than 2 seconds.  Neurological:     General: No focal deficit present.     Mental Status: He is alert and oriented to person, place, and time.  Psychiatric:        Mood and Affect: Mood normal.        Behavior: Behavior normal.     ED Results / Procedures / Treatments   Labs (all labs ordered are listed, but only abnormal results are displayed) Labs Reviewed  URINALYSIS, ROUTINE W REFLEX MICROSCOPIC - Abnormal; Notable for the following components:      Result Value   Color, Urine STRAW (*)    Protein, ur 100 (*)    All other components within normal limits    EKG None  Radiology US SCROTUM W/DOPPLER  Result Date: 05/09/2022 CLINICAL DATA:  Right testicular pain EXAM: SCROTAL ULTRASOUND DOPPLER ULTRASOUND OF THE TESTICLES TECHNIQUE: Complete ultrasound  examination of the testicles, epididymis, and other scrotal structures was performed. Color and spectral Doppler ultrasound were also utilized to evaluate blood flow to the testicles. COMPARISON:  CT pelvis 02/04/2007 FINDINGS: Right testicle Measurements: 4.4 x 2.4 x 2.7 cm. No mass or microlithiasis visualized. Left testicle Measurements: 5.5 x 2.2 x 3.4 cm. No mass or microlithiasis visualized. Right epididymis: Right epididymis appears enlarged with increased vascularity on color flow Doppler imaging. No focal lesions. Left epididymis: Left epididymis appears enlarged with mildly increased vascularity on color flow Doppler imaging. No focal lesions. Hydrocele:  Small bilateral hydroceles. Varicocele:  None visualized. Pulsed Doppler interrogation of both testes demonstrates normal low resistance arterial and venous waveforms bilaterally. IMPRESSION: 1. Epididymal enlargement with increased vascularity bilaterally, greater on the right. Changes may represent epididymitis. No abscess. 2. Testicles appear otherwise normal. No evidence of mass or torsion. Electronically Signed   By: Lucienne Capers M.D.   On: 05/09/2022 20:49    Procedures Procedures    Medications Ordered in ED Medications  azithromycin (ZITHROMAX) tablet 1,000 mg (has no administration in time range)  cefTRIAXone (ROCEPHIN) injection 500 mg (has no administration in time range)  lidocaine (PF) (XYLOCAINE) 1 % injection 1-2.1 mL (has no administration in time range)    ED Course/ Medical Decision Making/ A&P                             Medical Decision Making Amount and/or Complexity of Data Reviewed Labs: ordered. Radiology: ordered.   This patient presents to the ED for concern of testicular pain, this involves an extensive number of treatment options, and is a complaint that carries with it a high risk of complications and morbidity.  The differential diagnosis includes torsion, epididymitis, uti   Co morbidities that  complicate the patient evaluation   htn, anxiety, and sickle cell trait   Additional history obtained:  Additional history obtained from epic chart review    Lab Tests:  I Ordered, and personally interpreted labs.  The pertinent results include:  ua neg   Imaging Studies ordered:  I ordered imaging studies including [place]  I independently visualized and interpreted imaging which showed  Epididymal enlargement with increased vascularity bilaterally,  greater on the right. Changes may represent epididymitis. No  abscess.  2. Testicles appear otherwise normal. No evidence of mass or  torsion.   I agree with the  radiologist interpretation   Cardiac Monitoring:  The patient was maintained on a cardiac monitor.  I personally viewed and interpreted the cardiac monitored which showed an underlying rhythm of: nsr   Medicines ordered and prescription drug management:  I ordered medication including rocephin/zithromax  for epididymitis  Reevaluation of the patient after these medicines showed that the patient improved I have reviewed the patients home medicines and have made adjustments as needed   Test Considered:  [place]   Problem List / ED Course:  Epididymitis:  pt given rocephin/zithromax in ED.  Pt d/c with cipro.   Reevaluation:  After the interventions noted above, I reevaluated the patient and found that they have :improved   Social Determinants of Health:  Lives at home   Dispostion:  After consideration of the diagnostic results and the patients response to treatment, I feel that the patent would benefit from discharge with outpatient f/u.          Final Clinical Impression(s) / ED Diagnoses Final diagnoses:  Epididymitis    Rx / DC Orders ED Discharge Orders          Ordered    ciprofloxacin (CIPRO) 500 MG tablet  2 times daily        05/09/22 2110              Isla Pence, MD 05/09/22 2112

## 2022-05-09 NOTE — ED Triage Notes (Signed)
Pt reports right testicle swelling for about a week. Pt reports penile drainage. Pt reports has a urologist who he saw last week. VSS, NAD at present.

## 2022-05-10 ENCOUNTER — Other Ambulatory Visit: Payer: Self-pay

## 2022-05-25 ENCOUNTER — Other Ambulatory Visit: Payer: No Typology Code available for payment source | Admitting: Urology

## 2022-05-25 ENCOUNTER — Encounter: Payer: Self-pay | Admitting: Urology

## 2022-05-25 NOTE — Progress Notes (Deleted)
Assessment: 1. Post-void dribbling   2. Urinary hesitancy     Plan: Continue alfuzosin Continue bladder diet  He is bothered by his voiding pattern and would like to proceed with repeat evaluation.  I recommended that we start with cystoscopy and consider the need for repeat urodynamics although both of these have been normal in the past.  He has essentially failed trials of medical management with alpha blockers and anticholinergics. Schedule for cystoscopy in the next 2-3 weeks.  Chief Complaint:  No chief complaint on file.   History of Present Illness:  Danny Bishop is a 32 y.o. male who is seen for further evaluation of lower urinary tract symptoms.  At his initial visit in 2/24, he reported urinary symptoms for a number of years.  He has a history of significant urinary frequency during the daytime.  He reported that the frequency had improved.  He currently voids every 2-4 hours.  He has urgency.  No nocturia.  His main symptoms are a sensation of incomplete emptying, intermittent stream, and postvoid dribbling.  No dysuria or gross hematuria.  No recent UTIs or STDs. IPSS = 24 QOL = 6/6. PVR = 91 ml.  He has been evaluated multiple times in the emergency room and health department.  All prior STD testing was negative.  He was previously evaluated by Dr. Isabel Caprice in [place].  He underwent urodynamic evaluation in April 2012.  This study showed increased sensitivity of the bladder with mild instability occurring at a volume of 300 mL, good flow rate and adequate emptying.  He has tried alpha-blocker's and anticholinergics including the Vesicare, Enablex, and Toviaz without benefit.  He was evaluated further at [facility] in January 2013.  CT imaging was unremarkable.  Cystoscopy showed no urethral strictures, normal prostate, and normal bladder.  He was given a trial of alfuzosin 10 mg daily in 2/24.  He was also given instructions on a bladder diet.  At his visit on 05/03/22,  he  continued on alfuzosin.  He had not noted any improvement in his urinary symptoms.  He continued to have a sensation of incomplete emptying and postvoid dribbling.  He reported that it takes approximately 4-5 minutes for him to try to empty his bladder completely.  He was not having any dysuria or gross hematuria. IPSS = 17 QOL = 6/6. PVR = 22 ml.  He was diagnosed with possible epididymitis by ED on 05/09/22.  He reported pain in both testicles.  He was treated with Cipro.  Portions of the above documentation were copied from a prior visit for review purposes only.   Past Medical History:  Past Medical History:  Diagnosis Date   Anxiety    Hypertension    Palpitations    Sickle cell trait (HCC)     Past Surgical History:  Past Surgical History:  Procedure Laterality Date   HAND SURGERY Right 2016   due to fracture involving index MCP    Allergies:  Allergies  Allergen Reactions   Egg-Derived Products Nausea And Vomiting    Family History:  Family History  Problem Relation Age of Onset   Sickle cell trait Father    CAD Brother    Sickle cell trait Brother     Social History:  Social History   Tobacco Use   Smoking status: Every Day    Packs/day: 0.50    Years: 10.00    Additional pack years: 0.00    Total pack years: 5.00  Types: Cigarettes   Smokeless tobacco: Never  Vaping Use   Vaping Use: Never used  Substance Use Topics   Alcohol use: Yes    Alcohol/week: 7.0 standard drinks of alcohol    Types: 7 Cans of beer per week    Comment: 12 oz   Drug use: Not Currently    Types: Marijuana    ROS: Constitutional:  Negative for fever, chills, weight loss CV: Negative for chest pain, previous MI, hypertension Respiratory:  Negative for shortness of breath, wheezing, sleep apnea, frequent cough GI:  Negative for nausea, vomiting, bloody stool, GERD  Physical exam: There were no vitals taken for this visit. GENERAL APPEARANCE:  Well appearing, well  developed, well nourished, NAD HEENT:  Atraumatic, normocephalic, oropharynx clear NECK:  Supple without lymphadenopathy or thyromegaly ABDOMEN:  Soft, non-tender, no masses EXTREMITIES:  Moves all extremities well, without clubbing, cyanosis, or edema NEUROLOGIC:  Alert and oriented x 3, normal gait, CN II-XII grossly intact MENTAL STATUS:  appropriate BACK:  Non-tender to palpation, No CVAT SKIN:  Warm, dry, and intact  Results: U/A:   Procedure:  Flexible Cystourethroscopy  Pre-operative Diagnosis: {cysto diagnosis:26394}  Post-operative Diagnosis: {cysto diagnosis:26394}  Anesthesia:  local with lidocaine jelly  Surgical Narrative:  After appropriate informed consent was obtained, the patient was prepped and draped in the usual sterile fashion in the supine position.  The patient was correctly identified and the proper procedure delineated prior to proceeding.  Sterile lidocaine gel was instilled in the urethra. The flexible cystoscope was introduced without difficulty.  Findings:  Anterior urethra: {anterior urethral findings:26395}  Posterior urethra: {post urethral findings:26396}  Bladder: {bladder findings:26397}  Ureteral orifices: {Normal/Abnormal Appearance:21344::"normal"}  Additional findings:  Saline bladder wash for cytology {WAS/WAS [phone]::"was not"} performed.    The cystoscope was then removed.  The patient tolerated the procedure well.

## 2022-05-26 ENCOUNTER — Other Ambulatory Visit: Payer: Self-pay | Admitting: Urology

## 2022-05-26 DIAGNOSIS — N3943 Post-void dribbling: Secondary | ICD-10-CM

## 2022-05-26 DIAGNOSIS — R339 Retention of urine, unspecified: Secondary | ICD-10-CM

## 2022-06-08 ENCOUNTER — Other Ambulatory Visit: Payer: Self-pay | Admitting: Pharmacist

## 2022-06-08 NOTE — Progress Notes (Signed)
Patient outreached by Michiel Cowboy, PharmD Candidate on 06/07/2021 to discuss hypertension.   Patient does not have an automated home blood pressure machine. Patient states he cannot afford extra expenses at this time. He notes that he has stopped his alfluzosin due to cost.   Medication review was performed. They are not taking medications as prescribed. Differences from their prescribed list include: not taking amlodipine due to side effect of "jitteriness," not taking afluzosin due to cost.   The following barriers to adherence were noted:  - They do have cost concerns. Patient has significant issues affording his medications. He seems to take his metoprolol succinate consistently, but is not taking amlodipine due to side effects nor alfluzosin due to cost.  - They do not have transportation concerns.  - They do not need assistance obtaining refills.  - They do not occasionally forget to take some of their prescribed medications. Patient takes metoprolol succinate consistently. Issues with other meds are detailed above.  - They do feel like one/some of their medications make them feel poorly. Amlodipine - jitteriness.  - They do not have questions or concerns about their medications.  - They do not have follow up scheduled with their primary care provider/cardiologist.   The following interventions were completed:  - Medications were reviewed  - Patient was educated on goal blood pressures and long term health implications of elevated blood pressure  - Patient was educated on how to access home blood pressure machine  - Patient was counseled on lifestyle modifications to improve blood pressure, including dietary improvements (minimizing intake of salt, caffeine, processed foods) and increased physical activity.  - Patient was counseled on benefit of tobacco cessation. He smokes 11 cigarettes per day and seems somewhat interested in quitting.   Patient's main concern is cost, which has been a  barrier to affording medications in the past. This is also a barrier to obtaining a home BP monitor. He says he is going to lose his insurance in 2 weeks when he finishes his work and would appreciate getting 90-day supplies of his meds and a BP monitor through insurance if possible.   The patient does not have follow-up scheduled. He is probably in need of follow-up with cardiology, urology, and primary care. Patient says cost has been an issue in the past with accessing clinical visits.    Michiel Cowboy, PharmD Candidate   Catie Eppie Gibson, PharmD, BCACP, CPP [facility] [phone]

## 2022-06-10 ENCOUNTER — Telehealth: Payer: Self-pay

## 2022-06-10 NOTE — Telephone Encounter (Signed)
Message received from [facility] noting that the patient is in need of financial assistance.  I called him and he explained that he will be losing his insurance at the end of next month.  He has family planning Medicaid but that does not cover medications, co-pays.  I explained to him that he may be eligible for full Medicaid as the eligibility criteria changed 01/15/2022.  His address listed in Epic is [place] [facility]) but he said he stays between [place] and [place].  The lease that he has in his name is in [place]. I told him that I could refer him to the [facility] DSS Medicaid caseworkers in our building to assist with applying for Medicaid but there is no guarantee that they will be able to assist him.  He agreed to have that referral placed.   I sent a message to the caseworkers requesting they call him about his eligibility for Medicaid.  I also scheduled him to see Dr Laural Benes 07/02/2022.

## 2022-06-29 ENCOUNTER — Encounter: Payer: Self-pay | Admitting: Cardiology

## 2022-06-29 ENCOUNTER — Ambulatory Visit: Payer: No Typology Code available for payment source | Attending: Cardiology | Admitting: Cardiology

## 2022-06-29 ENCOUNTER — Other Ambulatory Visit: Payer: Self-pay

## 2022-06-29 VITALS — BP 148/100 | HR 90 | Ht 72.0 in | Wt 193.4 lb

## 2022-06-29 DIAGNOSIS — R002 Palpitations: Secondary | ICD-10-CM | POA: Diagnosis not present

## 2022-06-29 DIAGNOSIS — F411 Generalized anxiety disorder: Secondary | ICD-10-CM | POA: Diagnosis not present

## 2022-06-29 MED ORDER — METOPROLOL SUCCINATE ER 50 MG PO TB24
50.0000 mg | ORAL_TABLET | Freq: Every day | ORAL | 3 refills | Status: DC
  Filled 2022-06-29: qty 30, 30d supply, fill #0

## 2022-06-29 NOTE — Progress Notes (Signed)
Cardiology Office Note:    Date:  06/29/2022   ID:  Danny Bishop, DOB [birth year], MRN [id number]  PCP:  Marcine Matar, MD   [facility] Providers Cardiologist:  Donato Schultz, MD     Referring MD: Marcine Matar, MD    History of Present Illness:    Danny Bishop is a 32 y.o. male here for the follow up palpitation, anxiety.  He was in the emergency department last on 01/06/2022.  Notes reviewed.  He has been anxious about this.  Been to [facility] and [facility] ER total 5 times. Feels a stop for a second. Miss a pattern. Good days a bad days. Brother died 45 with sudden death. He has had a couple episodes of palpitations lasting several seconds, felt dizzy but did not pass out.  Sinus tachycardia 102 with no other abnormalities on ECG from 01/18/2022 personally reviewed and interpreted  Takes metop every day. Feels like dizziness at times, feels faint. Worse when go to bed. Sometimes when working or driving.  His echocardiogram showed no morphologic evidence of hypertrophic cardiomyopathy.  Past Medical History:  Diagnosis Date   Anxiety    Hypertension    Palpitations    Sickle cell trait (HCC)     Past Surgical History:  Procedure Laterality Date   HAND SURGERY Right 2016   due to fracture involving index MCP    Current Medications: Current Meds  Medication Sig   metoprolol succinate (TOPROL-XL) 50 MG 24 hr tablet Take 1 tablet (50 mg total) by mouth daily. Take with or immediately following a meal.     Allergies:   Egg-derived products   Social History   Socioeconomic History   Marital status: Single    Spouse name: Not on file   Number of children: 5   Years of education: Not on file   Highest education level: Not on file  Occupational History   Occupation: Location manager  Tobacco Use   Smoking status: Every Day    Packs/day: 0.50    Years: 10.00    Additional pack years: 0.00    Total pack years: 5.00    Types: Cigarettes   Smokeless tobacco:  Never  Vaping Use   Vaping Use: Never used  Substance and Sexual Activity   Alcohol use: Yes    Alcohol/week: 7.0 standard drinks of alcohol    Types: 7 Cans of beer per week    Comment: 12 oz   Drug use: Not Currently    Types: Marijuana   Sexual activity: Yes    Birth control/protection: None  Other Topics Concern   Not on file  Social History Narrative   Not on file   Social Determinants of Health   Financial Resource Strain: Not on file  Food Insecurity: Not on file  Transportation Needs: Not on file  Physical Activity: Not on file  Stress: Not on file  Social Connections: Not on file     Family History: The patient's family history includes CAD in his brother; Sickle cell trait in his brother and father.  ROS:   Please see the history of present illness.     All other systems reviewed and are negative.  EKGs/Labs/Other Studies Reviewed:    The following studies were reviewed today: ER notes reviewed lab work reviewed Cardiac Studies & Procedures       ECHOCARDIOGRAM  ECHOCARDIOGRAM COMPLETE 03/05/2022  Narrative ECHOCARDIOGRAM REPORT    Patient Name:   Danny Bishop Date  of Exam: 03/05/2022 Medical Rec #:  [id number]      Height:       72.0 in Accession #:    [id number]     Weight:       181.0 lb Date of Birth:  1990/02/24      BSA:          2.042 m Patient Age:    31 years       BP:           130/90 mmHg Patient Gender: M              HR:           88 bpm. Exam Location:  [address]  Procedure: 2D Echo, Cardiac Doppler and Color Doppler  Indications:    R07.9 Chest pain  History:        Patient has no prior history of Echocardiogram examinations. Palpitations, Signs/Symptoms:Chest Pain; Risk Factors:Hypertension and Current Smoker.  Sonographer:    Samule Ohm RDCS Referring Phys: [name]  IMPRESSIONS   1. Left ventricular ejection fraction, by estimation, is 60 to 65%. The left ventricle has normal function. The left  ventricle has no regional wall motion abnormalities. Left ventricular diastolic parameters were normal. 2. Right ventricular systolic function is normal. The right ventricular size is normal. 3. The mitral valve is normal in structure. Trivial mitral valve regurgitation. No evidence of mitral stenosis. 4. The aortic valve is tricuspid. Aortic valve regurgitation is not visualized. No aortic stenosis is present. 5. The inferior vena cava is normal in size with greater than 50% respiratory variability, suggesting right atrial pressure of 3 mmHg.  FINDINGS Left Ventricle: Left ventricular ejection fraction, by estimation, is 60 to 65%. The left ventricle has normal function. The left ventricle has no regional wall motion abnormalities. The left ventricular internal cavity size was normal in size. There is no left ventricular hypertrophy. Left ventricular diastolic parameters were normal.  Right Ventricle: The right ventricular size is normal. No increase in right ventricular wall thickness. Right ventricular systolic function is normal.  Left Atrium: Left atrial size was normal in size.  Right Atrium: Right atrial size was normal in size.  Pericardium: There is no evidence of pericardial effusion.  Mitral Valve: The mitral valve is normal in structure. Trivial mitral valve regurgitation. No evidence of mitral valve stenosis.  Tricuspid Valve: The tricuspid valve is normal in structure. Tricuspid valve regurgitation is trivial. No evidence of tricuspid stenosis.  Aortic Valve: The aortic valve is tricuspid. Aortic valve regurgitation is not visualized. No aortic stenosis is present.  Pulmonic Valve: The pulmonic valve was normal in structure. Pulmonic valve regurgitation is trivial. No evidence of pulmonic stenosis.  Aorta: The aortic root is normal in size and structure.  Venous: The inferior vena cava is normal in size with greater than 50% respiratory variability, suggesting right atrial  pressure of 3 mmHg.  IAS/Shunts: No atrial level shunt detected by color flow Doppler.   LEFT VENTRICLE PLAX 2D LVIDd:         4.10 cm   Diastology LVIDs:         2.90 cm   LV e' medial:    9.57 cm/s LV PW:         1.30 cm   LV E/e' medial:  8.0 LV IVS:        1.20 cm   LV e' lateral:   18.30 cm/s LVOT diam:     2.20 cm  LV E/e' lateral: 4.2 LV SV:         60 LV SV Index:   30 LVOT Area:     3.80 cm   RIGHT VENTRICLE             IVC RV S prime:     12.40 cm/s  IVC diam: 1.70 cm TAPSE (M-mode): 2.0 cm  LEFT ATRIUM             Index        RIGHT ATRIUM           Index LA diam:        3.50 cm 1.71 cm/m   RA Pressure: 3.00 mmHg LA Vol (A2C):   57.5 ml 28.16 ml/m  RA Area:     18.90 cm LA Vol (A4C):   37.0 ml 18.12 ml/m  RA Volume:   56.50 ml  27.67 ml/m LA Biplane Vol: 45.6 ml 22.33 ml/m AORTIC VALVE LVOT Vmax:   83.80 cm/s LVOT Vmean:  54.800 cm/s LVOT VTI:    0.159 m  AORTA Ao Root diam: 3.30 cm Ao Asc diam:  3.20 cm  MITRAL VALVE               TRICUSPID VALVE MV Area (PHT): 3.37 cm    Estimated RAP:  3.00 mmHg MV Decel Time: 225 msec MV E velocity: 77.00 cm/s  SHUNTS MV A velocity: 67.60 cm/s  Systemic VTI:  0.16 m MV E/A ratio:  1.14        Systemic Diam: 2.20 cm  Charlton Haws MD Electronically signed by Charlton Haws MD Signature Date/Time: 03/05/2022/12:18:02 PM    Final    MONITORS  LONG TERM MONITOR (3-14 DAYS) 03/29/2022  Narrative   Predominant rhythm is sinus with average heart rate of 102 bpm.  During sleep, minimum heart rate was 55 bpm.   Rare episodes of atrial tachycardia noted.   There was a 9-second episode of wide-complex tachycardia with average heart rate of 135 bpm, slow VT.  There is no significant change in heart rate from narrow complex preceding onset and narrow complex preceding termination.   Rare PVCs and PACs.   No atrial fibrillation present.   Symptomatic episodes were associated with sinus tachycardia mostly in the 110  range.   Patch Wear Time:  12 days and 3 hours (2023-12-16T12:23:02-498 to 2023-12-28T16:05:16-0500)  Patient had a min HR of 55 bpm, max HR of 182 bpm, and avg HR of 102 bpm. Predominant underlying rhythm was Sinus Rhythm. 1 run of Ventricular Tachycardia occurred lasting 9.4 secs with a max rate of 146 bpm (avg 135 bpm). 3 Supraventricular Tachycardia runs occurred, the run with the fastest interval lasting 10 beats with a max rate of 176 bpm (avg 156 bpm); the run with the fastest interval was also the longest. Isolated SVEs were rare (<1.0%), SVE Couplets were rare (<1.0%), and SVE Triplets were rare (<1.0%). Isolated VEs were rare (<1.0%), VE Couplets were rare (<1.0%), and no VE Triplets were present.            EKG:  The ekg ordered today demonstrates EKG normal from above  Recent Labs: 10/14/2021: ALT 29 01/06/2022: Magnesium 2.3; TSH 0.610 01/15/2022: BUN 12; Creatinine, Ser 0.74; Hemoglobin 14.0; Platelets 314; Potassium 3.9; Sodium 143  Recent Lipid Panel No results found for: "CHOL", "TRIG", "HDL", "CHOLHDL", "VLDL", "LDLCALC", "LDLDIRECT"   Risk Assessment/Calculations:              Physical Exam:  VS:  BP (!) 148/100   Pulse 90   Ht 6' (1.829 m)   Wt 193 lb 6.4 oz (87.7 kg)   SpO2 97%   BMI 26.23 kg/m     Wt Readings from Last 3 Encounters:  06/29/22 193 lb 6.4 oz (87.7 kg)  05/09/22 190 lb (86.2 kg)  05/03/22 185 lb (83.9 kg)     GEN:  Well nourished, well developed in no acute distress HEENT: Normal NECK: No JVD; No carotid bruits LYMPHATICS: No lymphadenopathy CARDIAC: RRR, no murmurs, rubs, gallops RESPIRATORY:  Clear to auscultation without rales, wheezing or rhonchi  ABDOMEN: Soft, non-tender, non-distended MUSCULOSKELETAL:  No edema; No deformity  SKIN: Warm and dry NEUROLOGIC:  Alert and oriented x 3 PSYCHIATRIC:  Normal affect   ASSESSMENT:    1. Palpitations   2. GAD (generalized anxiety disorder)     PLAN:    In order of  problems listed above:  Anxiety Palpitations He is very sensitive to palpitations, skips, subtle pauses.  These could all be PVCs.  We went over Zio patch monitor.  I have described these sensations to him and the physiology behind PVCs.  I have increased his metoprolol from 25 to 50 mg once a day.  His anxiety has been quite high.  It would benefit him likely to be on SSRI like Lexapro for instance.  He may have previously been on 1 of these medications.  I did explain that it sometimes takes time to figure out which one works best.  He did not like the way the hydroxyzine made him feel. He is going to be talking with Dr. Laural Benes soon.  I appreciate his honesty with me.  He has been drinking more alcohol recently.  He is not having any delusional thoughts.  His brother died last year.  Increased stress.  He has had trouble with his bladder.          Medication Adjustments/Labs and Tests Ordered: Current medicines are reviewed at length with the patient today.  Concerns regarding medicines are outlined above.  No orders of the defined types were placed in this encounter.  Meds ordered this encounter  Medications   metoprolol succinate (TOPROL-XL) 50 MG 24 hr tablet    Sig: Take 1 tablet (50 mg total) by mouth daily. Take with or immediately following a meal.    Dispense:  90 tablet    Refill:  3    Patient Instructions  Medication Instructions:  Please increase Metoprolol Succinate to 50 mg a day. Continue all other medications as listed.  *If you need a refill on your cardiac medications before your next appointment, please call your pharmacy*  Follow-Up: At [facility], you and your health needs are our priority.  As part of our continuing mission to provide you with exceptional heart care, we have created designated Provider Care Teams.  These Care Teams include your primary Cardiologist (physician) and Advanced Practice Providers (APPs -  Physician Assistants and Nurse  Practitioners) who all work together to provide you with the care you need, when you need it.  We recommend signing up for the patient portal called "MyChart".  Sign up information is provided on this After Visit Summary.  MyChart is used to connect with patients for Virtual Visits (Telemedicine).  Patients are able to view lab/test results, encounter notes, upcoming appointments, etc.  Non-urgent messages can be sent to your provider as well.   To learn more about what you can do with  MyChart, go to [email or web address].    Your next appointment:   6 month(s)  Provider:   Jari Favre, PA-C, Robin Searing, NP, or Tereso Newcomer, PA-C            Signed, Donato Schultz, MD  06/29/2022 3:51 PM    [facility]

## 2022-06-29 NOTE — Patient Instructions (Signed)
Medication Instructions:  Please increase Metoprolol Succinate to 50 mg a day. Continue all other medications as listed.  *If you need a refill on your cardiac medications before your next appointment, please call your pharmacy*  Follow-Up: At [facility], you and your health needs are our priority.  As part of our continuing mission to provide you with exceptional heart care, we have created designated Provider Care Teams.  These Care Teams include your primary Cardiologist (physician) and Advanced Practice Providers (APPs -  Physician Assistants and Nurse Practitioners) who all work together to provide you with the care you need, when you need it.  We recommend signing up for the patient portal called "MyChart".  Sign up information is provided on this After Visit Summary.  MyChart is used to connect with patients for Virtual Visits (Telemedicine).  Patients are able to view lab/test results, encounter notes, upcoming appointments, etc.  Non-urgent messages can be sent to your provider as well.   To learn more about what you can do with MyChart, go to [email or web address].    Your next appointment:   6 month(s)  Provider:   Jari Favre, PA-C, Robin Searing, NP, or Tereso Newcomer, PA-C

## 2022-06-30 ENCOUNTER — Ambulatory Visit (HOSPITAL_COMMUNITY)
Admission: EM | Admit: 2022-06-30 | Discharge: 2022-06-30 | Disposition: A | Discharge status: Home / Self Care | Payer: No Typology Code available for payment source | Source: Home / Self Care

## 2022-06-30 ENCOUNTER — Emergency Department (HOSPITAL_COMMUNITY)
Admission: EM | Admit: 2022-06-30 | Discharge: 2022-06-30 | Disposition: A | Discharge status: Home / Self Care | Payer: No Typology Code available for payment source | Attending: Emergency Medicine | Admitting: Emergency Medicine

## 2022-06-30 ENCOUNTER — Encounter (HOSPITAL_COMMUNITY): Payer: Self-pay

## 2022-06-30 ENCOUNTER — Emergency Department (HOSPITAL_COMMUNITY): Payer: No Typology Code available for payment source

## 2022-06-30 ENCOUNTER — Other Ambulatory Visit: Payer: Self-pay

## 2022-06-30 DIAGNOSIS — Z79899 Other long term (current) drug therapy: Secondary | ICD-10-CM | POA: Diagnosis not present

## 2022-06-30 DIAGNOSIS — R350 Frequency of micturition: Secondary | ICD-10-CM

## 2022-06-30 DIAGNOSIS — I10 Essential (primary) hypertension: Secondary | ICD-10-CM | POA: Diagnosis not present

## 2022-06-30 DIAGNOSIS — R300 Dysuria: Secondary | ICD-10-CM

## 2022-06-30 DIAGNOSIS — N50812 Left testicular pain: Secondary | ICD-10-CM | POA: Insufficient documentation

## 2022-06-30 DIAGNOSIS — N433 Hydrocele, unspecified: Secondary | ICD-10-CM | POA: Diagnosis not present

## 2022-06-30 LAB — URINALYSIS, W/ REFLEX TO CULTURE (INFECTION SUSPECTED)
Bacteria, UA: NONE SEEN
Bilirubin Urine: NEGATIVE
Glucose, UA: NEGATIVE mg/dL
Hgb urine dipstick: NEGATIVE
Ketones, ur: NEGATIVE mg/dL
Leukocytes,Ua: NEGATIVE
Nitrite: NEGATIVE
Protein, ur: 30 mg/dL — AB
Specific Gravity, Urine: 1.011 (ref 1.005–1.030)
pH: 6 (ref 5.0–8.0)

## 2022-06-30 LAB — POCT URINALYSIS DIP (MANUAL ENTRY)
Bilirubin, UA: NEGATIVE
Glucose, UA: NEGATIVE mg/dL
Ketones, POC UA: NEGATIVE mg/dL
Leukocytes, UA: NEGATIVE
Nitrite, UA: NEGATIVE
Protein Ur, POC: 30 mg/dL — AB
Spec Grav, UA: 1.02 (ref 1.010–1.025)
Urobilinogen, UA: 0.2 E.U./dL
pH, UA: 7 (ref 5.0–8.0)

## 2022-06-30 MED ORDER — DOXYCYCLINE HYCLATE 100 MG PO TABS: 100.0000 mg | ORAL_TABLET | Freq: Two times a day (BID) | ORAL | 0 refills | Status: DC

## 2022-06-30 MED ORDER — CEFTRIAXONE SODIUM 1 G IJ SOLR
500.0000 mg | Freq: Once | INTRAMUSCULAR | Status: AC
  Administered 2022-06-30: 500 mg via INTRAMUSCULAR
  Filled 2022-06-30: qty 10

## 2022-06-30 MED ORDER — LIDOCAINE HCL (PF) 1 % IJ SOLN
1.0000 mL | Freq: Once | INTRAMUSCULAR | Status: AC
  Administered 2022-06-30: 2.1 mL
  Filled 2022-06-30: qty 30

## 2022-06-30 NOTE — ED Provider Notes (Signed)
[facility]    CSN: [id number] Arrival date & time: 06/30/22  1500      History   Chief Complaint Chief Complaint  Patient presents with   Testicle Pain   Urinary Frequency    HPI Danny Bishop is a 32 y.o. male.   Patient reports frequent urination and testicular pain that has been ongoing, but worse in the last 2 weeks.  He does have some lower back pain that just started over the last week, he also started a job at [employer] within the last week as well.  He denies any dysuria or fevers.  Denies any new scrotal swelling, has been going to urology for urinary dribbling issue.  He is unsure if he has had penile discharge, reports he normally has a dribble, is unable to tell if anything is changed.  He is sexually active.   Was treated with PO Cpiro, IM Rocephin and PO Zithromax at the end of March at [facility] ED after [place] was concerning for epididymitis.     The history is provided by the patient and medical records.  Testicle Pain  Urinary Frequency    Past Medical History:  Diagnosis Date   Anxiety    Hypertension    Palpitations    Sickle cell trait [facility])     Patient Active Problem List   Diagnosis Date Noted   Post-void dribbling 04/02/2022   Incomplete bladder emptying 04/02/2022   Sickle cell trait (HCC) 09/21/2021   Essential hypertension 09/21/2021   Tobacco dependence 09/21/2021   Positive depression screening 09/21/2021   Stressful life event affecting family 09/21/2021   Anxiety attack 04/16/2011   GAD (generalized anxiety disorder) 04/16/2011   Weight loss, unintentional 04/16/2011   Diarrhea in adult patient 04/16/2011    Past Surgical History:  Procedure Laterality Date   HAND SURGERY Right 2016   due to fracture involving index MCP       Home Medications    Prior to Admission medications   Medication Sig Start Date End Date Taking? Authorizing Provider  alfuzosin (UROXATRAL) 10 MG 24 hr tablet TAKE 1 TABLET BY MOUTH EVERY  DAY Patient not taking: Reported on 06/29/2022 05/26/22   Milderd Meager., MD  amLODipine (NORVASC) 5 MG tablet Take 1 tablet (5 mg total) by mouth daily. Patient not taking: Reported on 06/29/2022 11/09/21   Marcine Matar, MD  hydrOXYzine (ATARAX) 25 MG tablet Take 1 tablet (25 mg total) by mouth 2 (two) times daily as needed for anxiety. Patient not taking: Reported on 06/29/2022 09/21/21   Marcine Matar, MD  metoprolol succinate (TOPROL-XL) 50 MG 24 hr tablet Take 1 tablet (50 mg total) by mouth daily. Take with or immediately following a meal. 06/29/22   Jake Bathe, MD    Family History Family History  Problem Relation Age of Onset   Sickle cell trait Father    CAD Brother    Sickle cell trait Brother     Social History Social History   Tobacco Use   Smoking status: Every Day    Packs/day: 0.35    Years: 10.00    Additional pack years: 0.00    Total pack years: 3.50    Types: Cigarettes   Smokeless tobacco: Never  Vaping Use   Vaping Use: Never used  Substance Use Topics   Alcohol use: Yes    Alcohol/week: 7.0 standard drinks of alcohol    Types: 7 Cans of beer per week   Drug  use: Not Currently    Types: Marijuana     Allergies   Egg-derived products   Review of Systems Review of Systems  Constitutional:  Negative for chills and fever.  Genitourinary:  Positive for frequency, scrotal swelling, testicular pain and urgency. Negative for penile pain and penile swelling.     Physical Exam Triage Vital Signs ED Triage Vitals [06/30/22 1553]  Enc Vitals Group     BP      Pulse      Resp      Temp      Temp src      SpO2      Weight      Height      Head Circumference      Peak Flow      Pain Score 4     Pain Loc      Pain Edu?      Excl. in GC?    No data found.  Updated Vital Signs BP (!) 163/100   Pulse 75   Temp 98 F (36.7 C) (Oral)   Resp 16   SpO2 98%   Visual Acuity Right Eye Distance:   Left Eye Distance:   Bilateral  Distance:    Right Eye Near:   Left Eye Near:    Bilateral Near:     Physical Exam Vitals and nursing note reviewed.  Constitutional:      Appearance: Normal appearance.  HENT:     Head: Normocephalic and atraumatic.     Right Ear: External ear normal.     Left Ear: External ear normal.     Nose: Nose normal.     Mouth/Throat:     Mouth: Mucous membranes are moist.  Eyes:     Conjunctiva/sclera: Conjunctivae normal.  Cardiovascular:     Rate and Rhythm: Normal rate.  Pulmonary:     Effort: Pulmonary effort is normal. No respiratory distress.  Genitourinary:    Penis: Normal and circumcised.      Testes:        Left: Swelling present.     Comments: Patient denies tenderness to palpation.  Left testes appears slightly swollen and enlarged.  Reports pain is intermittent, when it comes it is posterior and mostly left-sided. Musculoskeletal:        General: No swelling. Normal range of motion.  Skin:    General: Skin is warm and dry.  Neurological:     General: No focal deficit present.     Mental Status: He is alert and oriented to person, place, and time.  Psychiatric:        Mood and Affect: Mood normal.        Behavior: Behavior normal. Behavior is cooperative.      UC Treatments / Results  Labs (all labs ordered are listed, but only abnormal results are displayed) Labs Reviewed  POCT URINALYSIS DIP (MANUAL ENTRY) - Abnormal; Notable for the following components:      Result Value   Blood, UA trace-intact (*)    Protein Ur, POC =30 (*)    All other components within normal limits  URINE CULTURE    EKG   Radiology No results found.  Procedures Procedures (including critical care time)  Medications Ordered in UC Medications - No data to display  Initial Impression / Assessment and Plan / UC Course  I have reviewed the triage vital signs and the nursing notes.  Pertinent labs & imaging results that were available during my care  of the patient were  reviewed by me and considered in my medical decision making (see chart for details).  Vitals and triage reviewed, patient is hemodynamically stable.  Somewhat difficult historian, unable to answer most questions adequately.  Unclear if he has had penile drainage.  Is sexually active.  Posterior testicular pain suspicious for epididymitis.  The left testes appears slightly swollen and enlarged, advised that while this could be epididymitis, it could be something more concerning like torsion.  Advised to head to the emergency department for ultrasound and further evaluation.  Urinalysis positive for blood, will send for culture.  Patient to head via POV to ED.      Final Clinical Impressions(s) / UC Diagnoses   Final diagnoses:  Testicular pain, left  Urinary frequency   Discharge Instructions   None    ED Prescriptions   None    PDMP not reviewed this encounter.   Trevione Wert, [place], [place] 06/30/22 [phone]

## 2022-06-30 NOTE — Discharge Instructions (Addendum)
Your ultrasound today showed a small right hydrocele which is benign and can be evaluated outpatient by urology.  Please check MyChart for gonorrhea chlamydia results.  If it is positive please continue to take doxycycline. Take tylenol/ibuprofen for pain. I recommend close follow-up with PCP/urology for reevaluation.  Please do not hesitate to return to emergency department if worrisome signs symptoms we discussed become apparent.

## 2022-06-30 NOTE — ED Triage Notes (Signed)
Pt arrived via POV. C/o dysuria, urgency, frequency, and L scrotal swelling for 2x weeks.  Pt referred here form UC for scrotal doppler.

## 2022-06-30 NOTE — ED Triage Notes (Signed)
Patient reports that he had bilateral scrotal pain and swelling x 2 months. Patient states that he received a shot and is now having left scrotal pain and states that he has had increased urinary frequency in the past 2 weeks. Patient states he sees a urologist for urinary frequency, but worsened in the past 2 weeks.

## 2022-07-01 LAB — GC/CHLAMYDIA PROBE AMP (~~LOC~~) NOT AT ARMC
Chlamydia: NEGATIVE
Comment: NEGATIVE
Comment: NORMAL
Neisseria Gonorrhea: NEGATIVE

## 2022-07-01 NOTE — ED Provider Notes (Signed)
[place] EMERGENCY DEPARTMENT AT [facility] Provider Note   CSN: [id number] Arrival date & time: 06/30/22  1646     History  Chief Complaint  Patient presents with   Dysuria   Urinary Frequency   Groin Swelling    Danny Bishop is a 32 y.o. male with a past medical history of hypertension presents today for evaluation of dysuria.  Patient reports he has has pain with urination, increased urinary urgency and frequency in the last 2 weeks.  Patient also reports swelling to the left testicle.  Denies any redness to the scrotum.  Denies any rash in the genital area.  Patient was seen at urgent care earlier today and was referred for an ultrasound to rule out testicular torsion.  He denies any fever, abdominal pain, chest pain or shortness of breath.  States he has had penile discharge since he was 2.  He also endorses history of GC/chlamydia.  Patient is sexually active.  The history is provided by the patient.  Dysuria Presenting symptoms: dysuria   Context: during urination   Relieved by:  None tried Ineffective treatments:  None tried Associated symptoms: scrotal swelling, urinary frequency and urinary hesitation   Associated symptoms: no fever, no genital rash, no nausea and no vomiting   Urinary Frequency      Past Medical History:  Diagnosis Date   Anxiety    Hypertension    Palpitations    Sickle cell trait (HCC)    Past Surgical History:  Procedure Laterality Date   HAND SURGERY Right 2016   due to fracture involving index MCP     Home Medications Prior to Admission medications   Medication Sig Start Date End Date Taking? Authorizing Provider  doxycycline (VIBRA-TABS) 100 MG tablet Take 1 tablet (100 mg total) by mouth 2 (two) times daily. 06/30/22  Yes Jeanelle Malling, PA  alfuzosin (UROXATRAL) 10 MG 24 hr tablet TAKE 1 TABLET BY MOUTH EVERY DAY Patient not taking: Reported on 06/29/2022 05/26/22   Milderd Meager., MD  amLODipine (NORVASC) 5 MG  tablet Take 1 tablet (5 mg total) by mouth daily. Patient not taking: Reported on 06/29/2022 11/09/21   Marcine Matar, MD  hydrOXYzine (ATARAX) 25 MG tablet Take 1 tablet (25 mg total) by mouth 2 (two) times daily as needed for anxiety. Patient not taking: Reported on 06/29/2022 09/21/21   Marcine Matar, MD  metoprolol succinate (TOPROL-XL) 50 MG 24 hr tablet Take 1 tablet (50 mg total) by mouth daily. Take with or immediately following a meal. 06/29/22   Jake Bathe, MD      Allergies    Egg-derived products    Review of Systems   Review of Systems  Constitutional:  Negative for fever.  Gastrointestinal:  Negative for nausea and vomiting.  Genitourinary:  Positive for dysuria, frequency, hesitancy and scrotal swelling.    Physical Exam Updated Vital Signs BP (!) 167/113 (BP Location: Right Arm)   Pulse 83   Temp 98.1 F (36.7 C) (Oral)   Resp 17   SpO2 100%  Physical Exam Vitals and nursing note reviewed.  Constitutional:      Appearance: Normal appearance.  HENT:     Head: Normocephalic and atraumatic.     Mouth/Throat:     Mouth: Mucous membranes are moist.  Eyes:     General: No scleral icterus. Cardiovascular:     Rate and Rhythm: Normal rate and regular rhythm.     Pulses: Normal pulses.  Heart sounds: Normal heart sounds.  Pulmonary:     Effort: Pulmonary effort is normal.     Breath sounds: Normal breath sounds.  Abdominal:     General: Abdomen is flat.     Palpations: Abdomen is soft.     Tenderness: There is no abdominal tenderness.  Genitourinary:    Comments: GU exam with chaperone present throughout examination.  No rash visualized on my examination.  Patient has palpation to the left testicle.  No evidence of abscess or skin redness or cellulitis on the scrotum. Musculoskeletal:        General: No deformity.  Skin:    General: Skin is warm.     Findings: No rash.  Neurological:     General: No focal deficit present.     Mental Status: He  is alert.  Psychiatric:        Mood and Affect: Mood normal.     ED Results / Procedures / Treatments   Labs (all labs ordered are listed, but only abnormal results are displayed) Labs Reviewed  URINALYSIS, W/ REFLEX TO CULTURE (INFECTION SUSPECTED) - Abnormal; Notable for the following components:      Result Value   Color, Urine STRAW (*)    Protein, ur 30 (*)    All other components within normal limits  GC/CHLAMYDIA PROBE AMP () NOT AT [facility]    EKG None  Radiology US SCROTUM W/DOPPLER  Result Date: 06/30/2022 CLINICAL DATA:  Left scrotal pain, swelling EXAM: SCROTAL ULTRASOUND DOPPLER ULTRASOUND OF THE TESTICLES TECHNIQUE: Complete ultrasound examination of the testicles, epididymis, and other scrotal structures was performed. Color and spectral Doppler ultrasound were also utilized to evaluate blood flow to the testicles. COMPARISON:  05/09/2022 FINDINGS: Right testicle Measurements: 5.3 x 3.5 x 3.1 cm. No mass or microlithiasis visualized. Left testicle Measurements: 4.5 x 2.6 x 3.2 cm. No mass or microlithiasis visualized. Right epididymis:  Normal in size and appearance. Left epididymis:  Normal in size and appearance. Hydrocele:  Small right hydrocele Varicocele:  None visualized. Pulsed Doppler interrogation of both testes demonstrates normal low resistance arterial and venous waveforms bilaterally. IMPRESSION: No testicular abnormality or evidence of torsion. Small right hydrocele. Electronically Signed   By: Charlett Nose M.D.   On: 06/30/2022 19:01    Procedures Procedures    Medications Ordered in ED Medications  cefTRIAXone (ROCEPHIN) injection 500 mg (500 mg Intramuscular Given 06/30/22 2113)  lidocaine (PF) (XYLOCAINE) 1 % injection 1-2.1 mL (2.1 mLs Other Given 06/30/22 2113)    ED Course/ Medical Decision Making/ A&P                             Medical Decision Making Amount and/or Complexity of Data Reviewed Radiology:  ordered.  Risk Prescription drug management.   This patient presents to the ED for dysuria, testicular pain, this involves an extensive number of treatment options, and is a complaint that carries with a high risk of complications and morbidity.  The differential diagnosis includes epididymitis, orchitis, testicular torsion, STI, cellulitis, hydrocele, varicocele, hernias gangrene..  This is not an exhaustive list.  Lab tests: I ordered and personally interpreted labs.  The pertinent results include: GC/chlamydia pending.  Urinalysis unremarkable.  Imaging studies: I ordered imaging studies. I personally reviewed, interpreted imaging and agree with the radiologist's interpretations. The results include: Ultrasound show right hydrocele, no evidence of testicular torsion or abscess.  Problem list/ ED course/ Critical interventions/ Medical management:  HPI: See above Vital signs within normal range and stable throughout visit. Laboratory/imaging studies significant for: See above. On physical examination, patient is afebrile and appears in no acute distress. The patient is suffering from testicular pain, but based on the history, exam, and work up, I do not suspect that the patient has testicular torsion, abscess, severe cellulitis, Fournier's gangrene, orchitis, epididymitis, inguinal hernia or other emergent cause.  He also presents with dysuria for 2 weeks.  History is consistent with a possible STI.  GC/chlamydia ordered and pending.  Based on workup I have low suspicion for cystitis, orchitis at this time.  Will empirically treat for gonorrhea and chlamydia with IM ceftriaxone and p.o. doxycycline.  Strict return precaution discussed.  I have reviewed the patient home medicines and have made adjustments as needed.  Cardiac monitoring/EKG: The patient was maintained on a cardiac monitor.  I personally reviewed and interpreted the cardiac monitor which showed an underlying rhythm of: sinus  rhythm.  Additional history obtained: External records from outside source obtained and reviewed including: Chart review including previous notes, labs, imaging.  Consultations obtained:  Disposition Continued outpatient therapy. Follow-up with PCP recommended for reevaluation of symptoms. Treatment plan discussed with patient.  Pt acknowledged understanding was agreeable to the plan. Worrisome signs and symptoms were discussed with patient, and patient acknowledged understanding to return to the ED if they noticed these signs and symptoms. Patient was stable upon discharge.   This chart was dictated using voice recognition software.  Despite best efforts to proofread,  errors can occur which can change the documentation meaning.          Final Clinical Impression(s) / ED Diagnoses Final diagnoses:  Dysuria  Hydrocele, unspecified hydrocele type    Rx / DC Orders ED Discharge Orders          Ordered    doxycycline (VIBRA-TABS) 100 MG tablet  2 times daily        06/30/22 2103              Jeanelle Malling, PA 07/01/22 1204    Gloris Manchester, MD 07/05/22 [phone]

## 2022-07-02 ENCOUNTER — Ambulatory Visit: Payer: No Typology Code available for payment source | Admitting: Internal Medicine

## 2022-07-02 LAB — URINE CULTURE: Culture: NO GROWTH

## 2022-07-15 ENCOUNTER — Encounter: Payer: Self-pay | Admitting: Urology

## 2022-07-15 ENCOUNTER — Other Ambulatory Visit: Payer: No Typology Code available for payment source | Admitting: Urology

## 2022-07-15 NOTE — Progress Notes (Deleted)
Assessment: 1. Post-void dribbling   2. Incomplete bladder emptying     Plan: Continue bladder diet  He is bothered by his voiding pattern and would like to proceed with repeat evaluation.  I recommended that we start with cystoscopy and consider the need for repeat urodynamics although both of these have been normal in the past.  He has essentially failed trials of medical management with alpha blockers and anticholinergics. Schedule for cystoscopy in the next 2-3 weeks.  Chief Complaint:  No chief complaint on file.   History of Present Illness:  Danny Bishop is a 32 y.o. male who is seen for further evaluation of lower urinary tract symptoms.  At his initial visit in 2/24, he reported urinary symptoms for a number of years.  He has a history of significant urinary frequency during the daytime.  He reported that the frequency had improved.  He currently voids every 2-4 hours.  He has urgency.  No nocturia.  His main symptoms are a sensation of incomplete emptying, intermittent stream, and postvoid dribbling.  No dysuria or gross hematuria.  No recent UTIs or STDs. IPSS = 24 QOL = 6/6. PVR = 91 ml.  He has been evaluated multiple times in the emergency room and health department.  All prior STD testing was negative.  He was previously evaluated by Dr. Isabel Caprice in [place].  He underwent urodynamic evaluation in April 2012.  This study showed increased sensitivity of the bladder with mild instability occurring at a volume of 300 mL, good flow rate and adequate emptying.  He has tried alpha-blocker's and anticholinergics including the Vesicare, Enablex, and Toviaz without benefit.  He was evaluated further at [facility] in January 2013.  CT imaging was unremarkable.  Cystoscopy showed no urethral strictures, normal prostate, and normal bladder.  He was given a trial of alfuzosin 10 mg daily in 2/24.  He was also given instructions on a bladder diet.  At his visit in 3/24, he continued on  alfuzosin.  He had not noted any improvement in his urinary symptoms.  He continued to have a sensation of incomplete emptying and postvoid dribbling.  He reported that it takes approximately 4-5 minutes for him to try to empty his bladder completely.  He was not having any dysuria or gross hematuria. IPSS = 17 QOL = 6/6. PVR = 22 ml.  He was seen in the emergency room and March 2024 for evaluation of right testicular pain.  Scrotal ultrasound demonstrated epididymal enlargement bilaterally with increased vascularity greater on the right consistent with epididymitis.  He was treated with antibiotics.  He was again seen in the emergency room in May 2024 with testicular pain and urinary frequency.  Scrotal ultrasound at that time showed no testicular abnormality and no evidence of epididymitis.  He presents today for cystoscopy. He is no longer taking alfuzosin.  Portions of the above documentation were copied from a prior visit for review purposes only.   Past Medical History:  Past Medical History:  Diagnosis Date   Anxiety    Hypertension    Palpitations    Sickle cell trait (HCC)     Past Surgical History:  Past Surgical History:  Procedure Laterality Date   HAND SURGERY Right 2016   due to fracture involving index MCP    Allergies:  Allergies  Allergen Reactions   Egg-Derived Products Nausea And Vomiting    Family History:  Family History  Problem Relation Age of Onset   Sickle cell trait  Father    CAD Brother    Sickle cell trait Brother     Social History:  Social History   Tobacco Use   Smoking status: Every Day    Packs/day: 0.35    Years: 10.00    Additional pack years: 0.00    Total pack years: 3.50    Types: Cigarettes   Smokeless tobacco: Never  Vaping Use   Vaping Use: Never used  Substance Use Topics   Alcohol use: Yes    Alcohol/week: 7.0 standard drinks of alcohol    Types: 7 Cans of beer per week   Drug use: Not Currently    Types: Marijuana     ROS: Constitutional:  Negative for fever, chills, weight loss CV: Negative for chest pain, previous MI, hypertension Respiratory:  Negative for shortness of breath, wheezing, sleep apnea, frequent cough GI:  Negative for nausea, vomiting, bloody stool, GERD  Physical exam: There were no vitals taken for this visit. GENERAL APPEARANCE:  Well appearing, well developed, well nourished, NAD HEENT:  Atraumatic, normocephalic, oropharynx clear NECK:  Supple without lymphadenopathy or thyromegaly ABDOMEN:  Soft, non-tender, no masses EXTREMITIES:  Moves all extremities well, without clubbing, cyanosis, or edema NEUROLOGIC:  Alert and oriented x 3, normal gait, CN II-XII grossly intact MENTAL STATUS:  appropriate BACK:  Non-tender to palpation, No CVAT SKIN:  Warm, dry, and intact  Results: U/A:   Procedure:  Flexible Cystourethroscopy  Pre-operative Diagnosis: {cysto diagnosis:26394}  Post-operative Diagnosis: {cysto diagnosis:26394}  Anesthesia:  local with lidocaine jelly  Surgical Narrative:  After appropriate informed consent was obtained, the patient was prepped and draped in the usual sterile fashion in the supine position.  The patient was correctly identified and the proper procedure delineated prior to proceeding.  Sterile lidocaine gel was instilled in the urethra. The flexible cystoscope was introduced without difficulty.  Findings:  Anterior urethra: {anterior urethral findings:26395}  Posterior urethra: {post urethral findings:26396}  Bladder: {bladder findings:26397}  Ureteral orifices: {Normal/Abnormal Appearance:21344::"normal"}  Additional findings:  Saline bladder wash for cytology {WAS/WAS NOT:[phone]::"was not"} performed.    The cystoscope was then removed.  The patient tolerated the procedure well.

## 2022-09-30 ENCOUNTER — Encounter: Payer: Self-pay | Admitting: Internal Medicine

## 2022-09-30 ENCOUNTER — Ambulatory Visit: Payer: Self-pay | Attending: Internal Medicine | Admitting: Internal Medicine

## 2022-09-30 ENCOUNTER — Other Ambulatory Visit: Payer: Self-pay

## 2022-09-30 VITALS — BP 146/96 | HR 87 | Ht 73.0 in | Wt 195.0 lb

## 2022-09-30 DIAGNOSIS — Z79899 Other long term (current) drug therapy: Secondary | ICD-10-CM | POA: Insufficient documentation

## 2022-09-30 DIAGNOSIS — D573 Sickle-cell trait: Secondary | ICD-10-CM | POA: Insufficient documentation

## 2022-09-30 DIAGNOSIS — F17200 Nicotine dependence, unspecified, uncomplicated: Secondary | ICD-10-CM

## 2022-09-30 DIAGNOSIS — I10 Essential (primary) hypertension: Secondary | ICD-10-CM | POA: Insufficient documentation

## 2022-09-30 DIAGNOSIS — R002 Palpitations: Secondary | ICD-10-CM | POA: Insufficient documentation

## 2022-09-30 DIAGNOSIS — F17210 Nicotine dependence, cigarettes, uncomplicated: Secondary | ICD-10-CM | POA: Insufficient documentation

## 2022-09-30 DIAGNOSIS — Z8249 Family history of ischemic heart disease and other diseases of the circulatory system: Secondary | ICD-10-CM | POA: Insufficient documentation

## 2022-09-30 MED ORDER — METOPROLOL SUCCINATE ER 50 MG PO TB24
25.0000 mg | ORAL_TABLET | Freq: Every day | ORAL | 1 refills | Status: DC
  Filled 2022-09-30: qty 45, 90d supply, fill #0
  Filled 2023-03-16: qty 45, 90d supply, fill #1

## 2022-09-30 MED ORDER — NICOTINE 21 MG/24HR TD PT24
21.0000 mg | MEDICATED_PATCH | Freq: Every day | TRANSDERMAL | 1 refills | Status: DC
  Filled 2022-09-30: qty 28, 28d supply, fill #0

## 2022-09-30 MED ORDER — AMLODIPINE BESYLATE 5 MG PO TABS
5.0000 mg | ORAL_TABLET | Freq: Every day | ORAL | 1 refills | Status: DC
  Filled 2022-09-30: qty 90, 90d supply, fill #0

## 2022-09-30 NOTE — Progress Notes (Signed)
Patient ID: Danny Bishop, male    DOB: 05/26/1990  MRN: [id number]  CC: Hypertension (HTN f/u. /Discuss metoprolol - reports that med is causing "heart palpitation". Reports not taking metoprolol recently)   Subjective: Danny Bishop is a 32 y.o. male who presents for chronic ds management His concerns today include:  Patient with history of tobacco dependence, GAD, weight loss   Not taking any medications. States every thing makes him feel bad.  When I saw him 1 yr ago we left him on Norvasc for BP.  Placed on Metoprolol XL by Dr. Anne Fu for PVC.  Still get palpitations that are bothersome to him at times.  Denies any headaches or dizziness.  Still smoking 1/2 pk a cigarettes a day.  Other days he smokes cigars.  Reports he was previous smoking a pk cigarettes a day  or pk of cigars a day. Reports he would like to stop. He has sickle cell trait.  He is asymptomatic. Patient Active Problem List   Diagnosis Date Noted   Post-void dribbling 04/02/2022   Incomplete bladder emptying 04/02/2022   Sickle cell trait (HCC) 09/21/2021   Essential hypertension 09/21/2021   Tobacco dependence 09/21/2021   Positive depression screening 09/21/2021   Stressful life event affecting family 09/21/2021   Anxiety attack 04/16/2011   GAD (generalized anxiety disorder) 04/16/2011   Weight loss, unintentional 04/16/2011   Diarrhea in adult patient 04/16/2011     No current outpatient medications on file prior to visit.   No current facility-administered medications on file prior to visit.    Allergies  Allergen Reactions   Egg-Derived Products Nausea And Vomiting    Social History   Socioeconomic History   Marital status: Single    Spouse name: Not on file   Number of children: 5   Years of education: Not on file   Highest education level: Not on file  Occupational History   Occupation: Location manager  Tobacco Use   Smoking status: Every Day    Current packs/day: 0.35    Average  packs/day: 0.4 packs/day for 10.0 years (3.5 ttl pk-yrs)    Types: Cigarettes   Smokeless tobacco: Never  Vaping Use   Vaping status: Never Used  Substance and Sexual Activity   Alcohol use: Yes    Alcohol/week: 7.0 standard drinks of alcohol    Types: 7 Cans of beer per week   Drug use: Not Currently    Types: Marijuana   Sexual activity: Yes    Birth control/protection: None  Other Topics Concern   Not on file  Social History Narrative   Not on file   Social Determinants of Health   Financial Resource Strain: Not on file  Food Insecurity: Not on file  Transportation Needs: Not on file  Physical Activity: Not on file  Stress: Not on file  Social Connections: Unknown (06/30/2021)   Received from [facility]   Social Network    Social Network: Not on file  Intimate Partner Violence: Unknown (05/22/2021)   Received from [facility]   HITS    Physically Hurt: Not on file    Insult or Talk Down To: Not on file    Threaten Physical Harm: Not on file    Scream or Curse: Not on file    Family History  Problem Relation Age of Onset   Sickle cell trait Father    CAD Brother    Sickle cell trait Brother     Past Surgical History:  Procedure Laterality Date   HAND SURGERY Right 2016   due to fracture involving index MCP    ROS: Review of Systems Negative except as stated above  PHYSICAL EXAM: BP (!) 146/96 (BP Location: Left Arm, Patient Position: Sitting, Cuff Size: Normal)   Pulse 87   Ht 6\' 1"  (1.854 m)   Wt 195 lb (88.5 kg)   SpO2 99%   BMI 25.73 kg/m   Wt Readings from Last 3 Encounters:  09/30/22 195 lb (88.5 kg)  06/29/22 193 lb 6.4 oz (87.7 kg)  05/09/22 190 lb (86.2 kg)    Physical Exam   General appearance - alert, well appearing, young to middle-age African-American male and in no distress Mental status - normal mood, behavior, speech, dress, motor activity, and thought processes Neck - supple, no significant adenopathy Chest - clear to  auscultation, no wheezes, rales or rhonchi, symmetric air entry Heart - normal rate, regular rhythm, normal S1, S2, no murmurs, rubs, clicks or gallops Extremities - peripheral pulses normal, no pedal edema, no clubbing or cyanosis    09/30/2022    4:00 PM 11/09/2021    4:35 PM 09/21/2021   10:01 AM  Depression screen PHQ 2/9  Decreased Interest 1 0 0  Down, Depressed, Hopeless 1 2 1   PHQ - 2 Score 2 2 1   Altered sleeping 1 2 1   Tired, decreased energy 1 0 1  Change in appetite 2 0 0  Feeling bad or failure about yourself  0 0 0  Trouble concentrating 0 0 0  Moving slowly or fidgety/restless 1 0 1  Suicidal thoughts 0 0 0  PHQ-9 Score 7 4 4   Difficult doing work/chores  Somewhat difficult        Latest Ref Rng & Units 01/15/2022   12:05 PM 01/06/2022    8:48 AM 10/14/2021   12:46 PM  CMP  Glucose 70 - 99 mg/dL 93  96  98   BUN 6 - 20 mg/dL 12  13  11    Creatinine 0.61 - 1.24 mg/dL 5.78  4.69  6.29   Sodium 135 - 145 mmol/L 143  141  139   Potassium 3.5 - 5.1 mmol/L 3.9  4.4  3.9   Chloride 98 - 111 mmol/L 106  109  107   CO2 22 - 32 mmol/L 26  27  26    Calcium 8.9 - 10.3 mg/dL 9.7  9.3  9.0   Total Protein 6.5 - 8.1 g/dL   7.6   Total Bilirubin 0.3 - 1.2 mg/dL   0.6   Alkaline Phos 38 - 126 U/L   108   AST 15 - 41 U/L   20   ALT 0 - 44 U/L   29    Lipid Panel  No results found for: "CHOL", "TRIG", "HDL", "CHOLHDL", "VLDL", "LDLCALC", "LDLDIRECT"  CBC    Component Value Date/Time   WBC 5.4 01/15/2022 1205   RBC 4.37 01/15/2022 1205   HGB 14.0 01/15/2022 1205   HCT 39.7 01/15/2022 1205   PLT 314 01/15/2022 1205   MCV 90.8 01/15/2022 1205   MCH 32.0 01/15/2022 1205   MCHC 35.3 01/15/2022 1205   RDW 12.5 01/15/2022 1205   LYMPHSABS 2.1 01/15/2022 1205   MONOABS 0.6 01/15/2022 1205   EOSABS 0.0 01/15/2022 1205   BASOSABS 0.0 01/15/2022 1205    ASSESSMENT AND PLAN: 1. Essential hypertension Not at goal.  I recommend restarting the amlodipine or we can try him  with a different  blood pressure medication.  Patient prefers to get back on the amlodipine.  DASH diet encouraged. - amLODipine (NORVASC) 5 MG tablet; Take 1 tablet (5 mg total) by mouth daily.  Dispense: 90 tablet; Refill: 1  2. Sickle cell trait (HCC) Asymptomatic.  3. Tobacco dependence Pt is current smoker. Patient advised to quit smoking. Discussed health risks associated with smoking including lung and other types of cancers, chronic lung diseases and CV risks.. Pt ready/not ready to give trail of quitting.   Discussed methods to help quit .Pt wanting to try: Nicotine patches.  I went over the stepdown approach with him.  Start with the 21 mg patches daily.  Went over with him how to use. _3_ Minutes spent on counseling. F/U: Reassess progress on subsequent visit  - nicotine (NICODERM CQ - DOSED IN MG/24 HOURS) 21 mg/24hr patch; Place 1 patch (21 mg total) onto the skin daily.  Dispense: 28 patch; Refill: 1  4. Palpitations He is agreeable to restarting metoprolol but wants to do a lower dose.  I told him to cut the 50 mg tablet in half so that he takes 25 mg daily. - metoprolol succinate (TOPROL-XL) 50 MG 24 hr tablet; Take 0.5 tablets (25 mg total) by mouth daily. Take with or immediately following a meal.  Dispense: 45 tablet; Refill: 1     Patient was given the opportunity to ask questions.  Patient verbalized understanding of the plan and was able to repeat key elements of the plan.   This documentation was completed using Paediatric nurse.  Any transcriptional errors are unintentional.  No orders of the defined types were placed in this encounter.    Requested Prescriptions   Signed Prescriptions Disp Refills   metoprolol succinate (TOPROL-XL) 50 MG 24 hr tablet 45 tablet 1    Sig: Take 0.5 tablets (25 mg total) by mouth daily. Take with or immediately following a meal.   amLODipine (NORVASC) 5 MG tablet 90 tablet 1    Sig: Take 1 tablet (5 mg  total) by mouth daily.   nicotine (NICODERM CQ - DOSED IN MG/24 HOURS) 21 mg/24hr patch 28 patch 1    Sig: Place 1 patch (21 mg total) onto the skin daily.    Return in about 4 months (around 01/30/2023) for BP check Luke in 4 wks.  Jonah Blue, MD, FACP

## 2022-10-08 ENCOUNTER — Other Ambulatory Visit: Payer: Self-pay

## 2022-11-04 NOTE — Progress Notes (Unsigned)
   S:     No chief complaint on file.  32 y.o. male who presents for hypertension evaluation, education, and management.  PMH is significant for HTN, tob, dep, GAD, weight loss.  Patient was referred and last seen by Primary Care Provider, Dr. Laural Benes, on 09/30/22. Patient has not yet been seen by Pharmacy clinic  At last visit w/ Dr. Laural Benes on 8/15, BP was elevated 146/96 and patient reported not taking any BP medications. Patient was started on amlodipine 5 mg daily for BP, nicotine 21 mg for tob cessation, and metoprolol ER 25 mg daily for palpitations.   Today, patient arrives in *** spirits and presents without *** assistance. *** Denies dizziness, headache, blurred vision, swelling.   Patient reports hypertension was diagnosed in ***.   Family/Social history:  Fhx: sickle cell trait (Father, brother), CAD (brother) Tob: 0.5 PPD, wanting to quit EtOH:   Medication adherence *** . Patient has *** taken BP medications today.   Current antihypertensives include: amlodipine 5 mg daily, metoprolol succinate ER 25 mg daily  Antihypertensives tried in the past include: amlodipine, metoprolol  Reported home BP readings: ***  Patient reported dietary habits: Eats *** meals/day Breakfast: *** Lunch: *** Dinner: *** Snacks: *** Drinks: ***  Patient-reported exercise habits: ***  O:  ROS  Physical Exam  Last 3 Office BP readings: BP Readings from Last 3 Encounters:  09/30/22 (!) 146/96  06/30/22 (!) 167/113  06/30/22 (!) 163/100    BMET    Component Value Date/Time   NA 143 01/15/2022 1205   K 3.9 01/15/2022 1205   CL 106 01/15/2022 1205   CO2 26 01/15/2022 1205   GLUCOSE 93 01/15/2022 1205   BUN 12 01/15/2022 1205   CREATININE 0.74 01/15/2022 1205   CALCIUM 9.7 01/15/2022 1205   GFRNONAA >60 01/15/2022 1205   GFRAA >60 10/17/2019 2025    Renal function: CrCl cannot be calculated (Patient's most recent lab result is older than the maximum 21 days  allowed.).  Clinical ASCVD: {YES/NO:21197} The ASCVD Risk score (Arnett DK, et al., 2019) failed to calculate for the following reasons:   The 2019 ASCVD risk score is only valid for ages 34 to 43  Patient is participating in a Managed Medicaid Plan: No    A/P: Hypertension diagnosed *** currently *** on current medications. BP goal < 130/80 *** mmHg. Medication adherence appears ***. Control is suboptimal due to ***.  -{Meds adjust:18428} ***.  -{Meds adjust:18428} ***.  -Patient educated on purpose, proper use, and potential adverse effects of ***.  -F/u labs ordered - *** -Counseled on lifestyle modifications for blood pressure control including reduced dietary sodium, increased exercise, adequate sleep. -Encouraged patient to check BP at home and bring log of readings to next visit. Counseled on proper use of home BP cuff.   Results reviewed and written information provided.    Written patient instructions provided. Patient verbalized understanding of treatment plan.  Total time in face to face counseling *** minutes.    Follow-up:  Pharmacist in 1 month. PCP clinic visit in ***.  Patient seen with Rosina Lowenstein, PharmD Candidate [facility]

## 2022-11-05 ENCOUNTER — Ambulatory Visit: Payer: Medicaid Other | Admitting: Pharmacist

## 2022-12-02 LAB — URINALYSIS
Bilirubin, UA: NEGATIVE
Glucose, UA: NEGATIVE
Ketones, UA: NEGATIVE
Leukocytes,UA: NEGATIVE
Nitrite, UA: NEGATIVE
RBC, UA: NEGATIVE
Specific Gravity, UA: 1.02 (ref 1.005–1.030)
Urobilinogen, Ur: 0.2 mg/dL (ref 0.2–1.0)
pH, UA: 7 (ref 5.0–7.5)

## 2022-12-08 ENCOUNTER — Emergency Department (HOSPITAL_COMMUNITY): Payer: Medicaid Other

## 2022-12-08 ENCOUNTER — Emergency Department (HOSPITAL_COMMUNITY)
Admission: EM | Admit: 2022-12-08 | Discharge: 2022-12-09 | Disposition: A | Discharge status: Home / Self Care | Payer: Self-pay | Attending: Emergency Medicine | Admitting: Emergency Medicine

## 2022-12-08 ENCOUNTER — Other Ambulatory Visit: Payer: Self-pay

## 2022-12-08 ENCOUNTER — Encounter (HOSPITAL_COMMUNITY): Payer: Self-pay | Admitting: *Deleted

## 2022-12-08 DIAGNOSIS — R079 Chest pain, unspecified: Secondary | ICD-10-CM | POA: Insufficient documentation

## 2022-12-08 DIAGNOSIS — Z79899 Other long term (current) drug therapy: Secondary | ICD-10-CM | POA: Insufficient documentation

## 2022-12-08 DIAGNOSIS — F411 Generalized anxiety disorder: Secondary | ICD-10-CM | POA: Insufficient documentation

## 2022-12-08 DIAGNOSIS — I10 Essential (primary) hypertension: Secondary | ICD-10-CM | POA: Insufficient documentation

## 2022-12-08 DIAGNOSIS — R002 Palpitations: Secondary | ICD-10-CM | POA: Insufficient documentation

## 2022-12-08 LAB — BASIC METABOLIC PANEL
Anion gap: 9 (ref 5–15)
BUN: 10 mg/dL (ref 6–20)
CO2: 25 mmol/L (ref 22–32)
Calcium: 9.2 mg/dL (ref 8.9–10.3)
Chloride: 107 mmol/L (ref 98–111)
Creatinine, Ser: 0.74 mg/dL (ref 0.61–1.24)
GFR, Estimated: >60 mL/min (ref >60)
Glucose, Bld: 99 mg/dL (ref 70–99)
Potassium: 4.1 mmol/L (ref 3.5–5.1)
Sodium: 141 mmol/L (ref 135–145)

## 2022-12-08 LAB — TROPONIN I (HIGH SENSITIVITY)
Troponin I (High Sensitivity): 3 ng/L (ref <18)
Troponin I (High Sensitivity): 3 ng/L (ref <18)

## 2022-12-08 LAB — CBC
HCT: 39.6 % (ref 39.0–52.0)
Hemoglobin: 14 g/dL (ref 13.0–17.0)
MCH: 31.2 pg (ref 26.0–34.0)
MCHC: 35.4 g/dL (ref 30.0–36.0)
MCV: 88.2 fL (ref 80.0–100.0)
Platelets: 289 10*3/uL (ref 150–400)
RBC: 4.49 MIL/uL (ref 4.22–5.81)
RDW: 12.8 % (ref 11.5–15.5)
WBC: 6.4 10*3/uL (ref 4.0–10.5)
nRBC: 0 % (ref 0.0–0.2)

## 2022-12-08 MED ORDER — LORAZEPAM 1 MG PO TABS
1.0000 mg | ORAL_TABLET | Freq: Once | ORAL | Status: AC
  Administered 2022-12-08: 1 mg via ORAL
  Filled 2022-12-08: qty 1

## 2022-12-08 NOTE — ED Triage Notes (Signed)
The pt had chest pain earlier today  none now   he has had pain once or twice a week

## 2022-12-09 NOTE — Discharge Instructions (Signed)
You were seen today for ongoing palpitations.  It is important that you follow-up with cardiology regarding further medication options and workup.  Your workup today is reassuring.  You should also follow-up with your primary doctor.  You have significant anxiety regarding her symptoms and may benefit from anxiety reducing medications.

## 2022-12-09 NOTE — ED Provider Notes (Signed)
Danny Bishop EMERGENCY DEPARTMENT AT [facility] Provider Note   CSN: [id number] Arrival date & time: 12/08/22  1805     History  Chief Complaint  Patient presents with   Chest Pain    Danny Bishop is a 32 y.o. male.  HPI     This is a 32 year old male with a history of palpitations who presents with chest pain and palpitations.  Patient is currently on metoprolol for palpitations.  Patient reports that he was at work when he had a severe "attack" of palpitations.  He states "I had a heart attack."  When asked why he thinks this he states that it was worse than it had ever been.  He has seen cardiology and had a monitor which was reassuring.  He is on metoprolol which was recently increased but patient states he did not tolerate the increase in medication and "it did not make me feel good."  Patient denies any changes in diet or caffeine intake.  Denies drug use.  Does report intermittent alcohol use.  Cardiology notes reviewed.  Home Medications Prior to Admission medications   Medication Sig Start Date End Date Taking? Authorizing Provider  amLODipine (NORVASC) 5 MG tablet Take 1 tablet (5 mg total) by mouth daily. 09/30/22   Marcine Matar, MD  metoprolol succinate (TOPROL-XL) 50 MG 24 hr tablet Take 0.5 tablets (25 mg total) by mouth daily. Take with or immediately following a meal. 09/30/22   Marcine Matar, MD  nicotine (NICODERM CQ - DOSED IN MG/24 HOURS) 21 mg/24hr patch Place 1 patch (21 mg total) onto the skin daily. 09/30/22   Marcine Matar, MD      Allergies    Egg-derived products    Review of Systems   Review of Systems  Constitutional:  Negative for fever.  Respiratory:  Negative for shortness of breath.   Cardiovascular:  Positive for chest pain and palpitations.  All other systems reviewed and are negative.   Physical Exam Updated Vital Signs BP (!) 147/96   Pulse 90   Temp 98.1 F (36.7 C)   Resp 16   Ht 1.854 m (6\' 1" )   Wt  88.5 kg   SpO2 98%   BMI 25.74 kg/m  Physical Exam Vitals and nursing note reviewed.  Constitutional:      Appearance: He is well-developed. He is not ill-appearing.  HENT:     Head: Normocephalic and atraumatic.  Eyes:     Pupils: Pupils are equal, round, and reactive to light.  Cardiovascular:     Rate and Rhythm: Normal rate and regular rhythm.     Heart sounds: Normal heart sounds. No murmur heard. Pulmonary:     Effort: Pulmonary effort is normal. No respiratory distress.     Breath sounds: Normal breath sounds. No wheezing.  Abdominal:     General: Bowel sounds are normal.     Palpations: Abdomen is soft.     Tenderness: There is no abdominal tenderness. There is no rebound.  Musculoskeletal:     Cervical back: Neck supple.  Lymphadenopathy:     Cervical: No cervical adenopathy.  Skin:    General: Skin is warm and dry.  Neurological:     Mental Status: He is alert and oriented to person, place, and time.  Psychiatric:        Mood and Affect: Mood is anxious.     ED Results / Procedures / Treatments   Labs (all labs ordered are listed,  but only abnormal results are displayed) Labs Reviewed  BASIC METABOLIC PANEL  CBC  TROPONIN I (HIGH SENSITIVITY)  TROPONIN I (HIGH SENSITIVITY)    EKG EKG Interpretation Date/Time:  Wednesday December 08 2022 18:47:42 EDT Ventricular Rate:  94 PR Interval:  142 QRS Duration:  88 QT Interval:  352 QTC Calculation: 440 R Axis:   35  Text Interpretation: Normal sinus rhythm Normal ECG When compared with ECG of 15-Jan-2022 11:15, PREVIOUS ECG IS PRESENT Confirmed by Ross Marcus ([id number]) on 12/08/2022 10:55:51 PM  Radiology DG Chest 2 View  Result Date: 12/08/2022 CLINICAL DATA:  Chest pain, dizziness. EXAM: CHEST - 2 VIEW COMPARISON:  01/06/2022. FINDINGS: The heart size and mediastinal contours are within normal limits. Both lungs are clear. No acute osseous abnormality. IMPRESSION: No active cardiopulmonary disease.  Electronically Signed   By: Thornell Sartorius M.D.   On: 12/08/2022 21:23    Procedures Procedures    Medications Ordered in ED Medications  LORazepam (ATIVAN) tablet 1 mg (1 mg Oral Given 12/08/22 2358)    ED Course/ Medical Decision Making/ A&P                                 Medical Decision Making Risk Prescription drug management.   This patient presents to the ED for concern of palpitations, this involves an extensive number of treatment options, and is a complaint that carries with it a high risk of complications and morbidity.  I considered the following differential and admission for this acute, potentially life threatening condition.  The differential diagnosis includes arrhythmia, ACS, symptomatic PVCs, anxiety  MDM:    This is a 32 year old male who presents with recurrent palpitations.  Has been seen and evaluated by cardiology.  Currently on metoprolol.  States that this was a significant event for him.  He appears very anxious regarding his symptoms.  Appears that he has had similar anxiety regarding symptoms in the past.  Has not tolerated an increase in metoprolol.  Today's EKG is sinus rhythm without evidence of arrhythmia or ischemia.  Chest x-ray without pneumothorax or pneumonia.  Troponin x 2 negative.  Basic lab work is reassuring.  I spent some time reassuring the patient that his workup was normal and I do not feel like he has had a heart attack or life-threatening event.  He does appear to have persistent anxiety regarding this event.  I have referred him to his primary physician.  He may benefit from an SSRI or long-term anxiety medication.  He was given a dose of Ativan here.  I have also referred him back to cardiology.  (Labs, imaging, consults)  Labs: I Ordered, and personally interpreted labs.  The pertinent results include: CBC, BMP, troponin x 2  Imaging Studies ordered: I ordered imaging studies including chest x-ray I independently visualized and  interpreted imaging. I agree with the radiologist interpretation  Additional history obtained from chart review.  External records from outside source obtained and reviewed including cardiology notes  Cardiac Monitoring: The patient was maintained on a cardiac monitor.  If on the cardiac monitor, I personally viewed and interpreted the cardiac monitored which showed an underlying rhythm of: Sinus rhythm  Reevaluation: After the interventions noted above, I reevaluated the patient and found that they have :stayed the same  Social Determinants of Health:  lives independently  Disposition: Discharge  Co morbidities that complicate the patient evaluation  Past Medical History:  Diagnosis Date   Anxiety    Hypertension    Palpitations    Sickle cell trait (HCC)      Medicines Meds ordered this encounter  Medications   LORazepam (ATIVAN) tablet 1 mg    I have reviewed the patients home medicines and have made adjustments as needed  Problem List / ED Course: Problem List Items Addressed This Visit   None Visit Diagnoses     Palpitations    -  Primary   Generalized anxiety disorder       Relevant Medications   LORazepam (ATIVAN) tablet 1 mg (Completed)                   Final Clinical Impression(s) / ED Diagnoses Final diagnoses:  Palpitations  Generalized anxiety disorder    Rx / DC Orders ED Discharge Orders     None         Shon Baton, MD 12/09/22 [phone]

## 2022-12-23 ENCOUNTER — Inpatient Hospital Stay: Payer: Self-pay | Admitting: Physician Assistant

## 2022-12-23 NOTE — Progress Notes (Deleted)
Patient ID: Danny Bishop, male   DOB: 1990/06/07, 32 y.o.   MRN: [id number]   After ED visit 12/08/2022 for palpitations This is a 32 year old male with a history of palpitations who presents with chest pain and palpitations.  Patient is currently on metoprolol for palpitations.  Patient reports that he was at work when he had a severe "attack" of palpitations.  He states "I had a heart attack."  When asked why he thinks this he states that it was worse than it had ever been.  He has seen cardiology and had a monitor which was reassuring.  He is on metoprolol which was recently increased but patient states he did not tolerate the increase in medication and "it did not make me feel good."  Patient denies any changes in diet or caffeine intake.  Denies drug use.  Does report intermittent alcohol use.    MDM:     This is a 32 year old male who presents with recurrent palpitations.  Has been seen and evaluated by cardiology.  Currently on metoprolol.  States that this was a significant event for him.  He appears very anxious regarding his symptoms.  Appears that he has had similar anxiety regarding symptoms in the past.  Has not tolerated an increase in metoprolol.  Today's EKG is sinus rhythm without evidence of arrhythmia or ischemia.  Chest x-ray without pneumothorax or pneumonia.  Troponin x 2 negative.  Basic lab work is reassuring.  I spent some time reassuring the patient that his workup was normal and I do not feel like he has had a heart attack or life-threatening event.  He does appear to have persistent anxiety regarding this event.  I have referred him to his primary physician.  He may benefit from an SSRI or long-term anxiety medication.  He was given a dose of Ativan here.  I have also referred him back to cardiology.

## 2023-02-04 ENCOUNTER — Ambulatory Visit: Payer: Medicaid Other | Admitting: Internal Medicine

## 2023-03-16 ENCOUNTER — Other Ambulatory Visit: Payer: Self-pay

## 2023-04-17 ENCOUNTER — Encounter (HOSPITAL_COMMUNITY): Payer: Self-pay

## 2023-04-17 ENCOUNTER — Emergency Department (HOSPITAL_COMMUNITY)
Admission: EM | Admit: 2023-04-17 | Discharge: 2023-04-17 | Disposition: A | Discharge status: Home / Self Care | Payer: Self-pay | Attending: Emergency Medicine | Admitting: Emergency Medicine

## 2023-04-17 ENCOUNTER — Other Ambulatory Visit: Payer: Self-pay

## 2023-04-17 ENCOUNTER — Emergency Department (HOSPITAL_COMMUNITY): Payer: Self-pay

## 2023-04-17 DIAGNOSIS — N50812 Left testicular pain: Secondary | ICD-10-CM

## 2023-04-17 DIAGNOSIS — Z79899 Other long term (current) drug therapy: Secondary | ICD-10-CM | POA: Insufficient documentation

## 2023-04-17 DIAGNOSIS — I10 Essential (primary) hypertension: Secondary | ICD-10-CM | POA: Insufficient documentation

## 2023-04-17 LAB — URINALYSIS, ROUTINE W REFLEX MICROSCOPIC
Bilirubin Urine: NEGATIVE
Glucose, UA: NEGATIVE mg/dL
Hgb urine dipstick: NEGATIVE
Ketones, ur: NEGATIVE mg/dL
Leukocytes,Ua: NEGATIVE
Nitrite: NEGATIVE
Protein, ur: NEGATIVE mg/dL
Specific Gravity, Urine: 1.009 (ref 1.005–1.030)
pH: 7 (ref 5.0–8.0)

## 2023-04-17 MED ORDER — DOXYCYCLINE HYCLATE 100 MG PO TABS: 100.0000 mg | ORAL_TABLET | Freq: Two times a day (BID) | ORAL | 0 refills | Status: DC

## 2023-04-17 MED ORDER — LIDOCAINE HCL (PF) 1 % IJ SOLN
INTRAMUSCULAR | Status: AC
  Administered 2023-04-17: 1 mL
  Filled 2023-04-17: qty 5

## 2023-04-17 MED ORDER — CEFTRIAXONE SODIUM 500 MG IJ SOLR
500.0000 mg | Freq: Once | INTRAMUSCULAR | Status: AC
  Administered 2023-04-17: 500 mg via INTRAMUSCULAR
  Filled 2023-04-17: qty 500

## 2023-04-17 MED ORDER — LIDOCAINE HCL (PF) 1 % IJ SOLN: 1.0000 mL | Freq: Once | INTRAMUSCULAR | Status: AC

## 2023-04-17 NOTE — ED Notes (Signed)
 Pt understood d/c instructions and when to return to the ED. Pt understood medications and all questions were answered. Pt left to go home by himself

## 2023-04-17 NOTE — Care Management (Signed)
 Received consult for assistance with obtaining urology appointment, patient uninsured.  Will call Monday to ask for appt.  And will follow up with patient.

## 2023-04-17 NOTE — ED Provider Notes (Signed)
 [place] EMERGENCY DEPARTMENT AT [facility] Provider Note   CSN: [id number] Arrival date & time: 04/17/23  1218     History  Chief Complaint  Patient presents with   Testicle Pain    KACYN SOUDER is a 33 y.o. male, history of hypertension, who presents to the ED secondary to left testicle pain, has been going on for the last month couple months.  He states that he was seen for similar thing a few months ago, and got shot, and some medication for home, and got better.  He reports urgency, and dysuria, as well as penile discharge.  He states that his penile discharge, is present all the time, so he cannot tell me if it is abnormal or not.  Denies any abdominal pain, nausea, vomiting.  Is sexually active with the same partner.  Denies any swelling of the testicle, trauma, or rash.  Never followed up regarding this pain.    Home Medications Prior to Admission medications   Medication Sig Start Date End Date Taking? Authorizing Provider  doxycycline (VIBRA-TABS) 100 MG tablet Take 1 tablet (100 mg total) by mouth 2 (two) times daily. 04/17/23  Yes Julie Nay L, PA  amLODipine (NORVASC) 5 MG tablet Take 1 tablet (5 mg total) by mouth daily. 09/30/22   Marcine Matar, MD  metoprolol succinate (TOPROL-XL) 50 MG 24 hr tablet Take 0.5 tablets (25 mg total) by mouth daily. Take with or immediately following a meal. 09/30/22   Marcine Matar, MD  nicotine (NICODERM CQ - DOSED IN MG/24 HOURS) 21 mg/24hr patch Place 1 patch (21 mg total) onto the skin daily. 09/30/22   Marcine Matar, MD      Allergies    Egg-derived products    Review of Systems   Review of Systems  Genitourinary:  Positive for testicular pain and urgency. Negative for penile swelling and scrotal swelling.    Physical Exam Updated Vital Signs BP (!) 160/105 (BP Location: Right Arm)   Pulse 91   Temp 98.3 F (36.8 C)   Resp 16   Ht 6' (1.829 m)   Wt 78.5 kg   SpO2 100%   BMI 23.46 kg/m   Physical Exam Vitals and nursing note reviewed. Exam conducted with a chaperone present.  Constitutional:      General: He is not in acute distress.    Appearance: He is well-developed.  HENT:     Head: Normocephalic and atraumatic.  Eyes:     Conjunctiva/sclera: Conjunctivae normal.  Cardiovascular:     Rate and Rhythm: Normal rate and regular rhythm.     Heart sounds: No murmur heard. Pulmonary:     Effort: Pulmonary effort is normal. No respiratory distress.     Breath sounds: Normal breath sounds.  Abdominal:     Palpations: Abdomen is soft.     Tenderness: There is no abdominal tenderness.     Hernia: There is no hernia in the left inguinal area or right inguinal area.  Genitourinary:    Pubic Area: No rash.      Penis: Normal. No erythema, tenderness, discharge, swelling or lesions.      Testes: Cremasteric reflex is present.        Right: Mass, tenderness, swelling, testicular hydrocele or varicocele not present. Right testis is descended. Cremasteric reflex is present.         Left: Tenderness present.     Epididymis:     Right: Normal.  Left: Normal.     Comments: Tenderness to palpation, of entire left testicle.  Reflexes intact.  No edema, warmth of the area. Musculoskeletal:        General: No swelling.     Cervical back: Neck supple.  Lymphadenopathy:     Lower Body: No right inguinal adenopathy. No left inguinal adenopathy.  Skin:    General: Skin is warm and dry.     Capillary Refill: Capillary refill takes less than 2 seconds.  Neurological:     Mental Status: He is alert.  Psychiatric:        Mood and Affect: Mood normal.     ED Results / Procedures / Treatments   Labs (all labs ordered are listed, but only abnormal results are displayed) Labs Reviewed  URINALYSIS, ROUTINE W REFLEX MICROSCOPIC - Abnormal; Notable for the following components:      Result Value   Color, Urine STRAW (*)    All other components within normal limits  GC/CHLAMYDIA  PROBE AMP ([place]) NOT AT [facility]    EKG None  Radiology US SCROTUM W/DOPPLER Result Date: 04/17/2023 CLINICAL DATA:  Left scrotal pain for several months. EXAM: SCROTAL ULTRASOUND DOPPLER ULTRASOUND OF THE TESTICLES TECHNIQUE: Complete ultrasound examination of the testicles, epididymis, and other scrotal structures was performed. Color and spectral Doppler ultrasound were also utilized to evaluate blood flow to the testicles. COMPARISON:  Previous ultrasound 06/30/2022 and 05/09/2022. FINDINGS: Right testicle Measurements: 5.6 x 2.2 x 3.1 cm. Normal blood flow with color Doppler. No mass or microlithiasis visualized. Left testicle Measurements: 4.3 x 2.2 x 3.4 cm. Normal blood flow with color Doppler. No mass or microlithiasis visualized. Right epididymis: Nisha Dhami hypoechoic lesion in the epididymal head measuring 7 x 7 x 8 mm, possibly present before. This demonstrates no internal blood flow with color Doppler and may reflect a Lorianna Spadaccini epididymal cyst or spermatocele. Left epididymis:  Normal in size and appearance. Hydrocele:  Granger Chui bilateral hydroceles. Varicocele:  None visualized. Pulsed Doppler interrogation of both testes demonstrates normal low resistance arterial and venous waveforms bilaterally. IMPRESSION: 1. No evidence of testicular mass or torsion. 2. Joniyah Mallinger bilateral hydroceles. 3. Dayani Winbush hypoechoic lesion in the right epididymal head, possibly present before and likely a Nykerria Macconnell epididymal cyst or spermatocele. 4. Given the patient's recurrent scrotal pain, recommend outpatient urology follow-up if not previously performed. Electronically Signed   By: Carey Bullocks M.D.   On: 04/17/2023 14:25    Procedures Procedures    Medications Ordered in ED Medications  cefTRIAXone (ROCEPHIN) injection 500 mg (has no administration in time range)  lidocaine (PF) (XYLOCAINE) 1 % injection 1-2.1 mL (has no administration in time range)    ED Course/ Medical Decision Making/ A&P                                  Medical Decision Making Patient is a 33 year old male, here with global left testicle pain, there is no abnormalities on exam except for generalized tenderness to palpation of left testicle, it is not edematous, nonerythematous.  He is sexually active, but states that with only 1 partner.  States last time he was given antibiotics, and got better, then reoccurred.  We obtain ultrasound, urinalysis, and GC, for further evaluation  Amount and/or Complexity of Data Reviewed Labs: ordered.    Details: Urinalysis unremarkable Radiology: ordered.    Details: Testicular ultrasound has no abnormal findings of left testicle Discussion of  management or test interpretation with external provider(s): Discussed with patient, ultrasound is unremarkable, given that this is a recurrent issue, I advised that he follow-up with urology.  He states he has no insurance so cannot, I placed a case management consultation and for the patient, to help them help him get insurance.  We will go ahead and treat him for possible STI, causing epididymitis, with ceftriaxone and doxycycline, and have him follow-up with PCP, and urology.  Risk Prescription drug management.    Final Clinical Impression(s) / ED Diagnoses Final diagnoses:  Pain in left testicle    Rx / DC Orders ED Discharge Orders          Ordered    doxycycline (VIBRA-TABS) 100 MG tablet  2 times daily        04/17/23 1502              Vincent Ehrler, [place], [place] 04/17/23 1507    Gwyneth Sprout, MD 04/17/23 2017

## 2023-04-17 NOTE — Discharge Instructions (Signed)
 Please follow-up with urologist, the cause of your pain is unclear at this time, we are going to go ahead and treat you, for possible infection, that is very important that you follow-up with urologist.  I have provided your information to our case manager, to help with insurance obtainment.  Please return to the ER if you have any fevers, chills, worsening pain, or severe testicle pain

## 2023-04-17 NOTE — ED Triage Notes (Signed)
 Pt arrived POV from home c/o testicle pain for several months. Pt states he was seen a couple months back for same and given something but it still hurts.

## 2023-04-19 LAB — GC/CHLAMYDIA PROBE AMP (~~LOC~~) NOT AT ARMC
Chlamydia: NEGATIVE
Comment: NEGATIVE
Comment: NORMAL
Neisseria Gonorrhea: NEGATIVE

## 2023-06-13 ENCOUNTER — Other Ambulatory Visit: Payer: Self-pay | Admitting: Internal Medicine

## 2023-06-13 ENCOUNTER — Telehealth: Payer: Self-pay | Admitting: Cardiology

## 2023-06-13 ENCOUNTER — Other Ambulatory Visit: Payer: Self-pay

## 2023-06-13 ENCOUNTER — Ambulatory Visit: Admitting: Physician Assistant

## 2023-06-13 ENCOUNTER — Encounter: Payer: Self-pay | Admitting: Physician Assistant

## 2023-06-13 VITALS — BP 177/111 | HR 85 | Ht 72.0 in | Wt 189.0 lb

## 2023-06-13 DIAGNOSIS — I10 Essential (primary) hypertension: Secondary | ICD-10-CM

## 2023-06-13 DIAGNOSIS — Z13220 Encounter for screening for lipoid disorders: Secondary | ICD-10-CM

## 2023-06-13 DIAGNOSIS — R002 Palpitations: Secondary | ICD-10-CM

## 2023-06-13 DIAGNOSIS — F411 Generalized anxiety disorder: Secondary | ICD-10-CM

## 2023-06-13 DIAGNOSIS — F439 Reaction to severe stress, unspecified: Secondary | ICD-10-CM | POA: Diagnosis not present

## 2023-06-13 DIAGNOSIS — R748 Abnormal levels of other serum enzymes: Secondary | ICD-10-CM

## 2023-06-13 MED ORDER — METOPROLOL SUCCINATE ER 50 MG PO TB24: 25.0000 mg | ORAL_TABLET | Freq: Every day | ORAL | 1 refills | Status: DC

## 2023-06-13 MED ORDER — HYDROXYZINE HCL 10 MG PO TABS
10.0000 mg | ORAL_TABLET | Freq: Three times a day (TID) | ORAL | 1 refills | Status: AC | PRN
  Filled 2023-06-13: qty 60, 20d supply, fill #0

## 2023-06-13 MED ORDER — AMLODIPINE BESYLATE 5 MG PO TABS
5.0000 mg | ORAL_TABLET | Freq: Every day | ORAL | 1 refills | Status: DC
  Filled 2023-06-13: qty 30, 30d supply, fill #0

## 2023-06-13 MED ORDER — METOPROLOL SUCCINATE ER 50 MG PO TB24
25.0000 mg | ORAL_TABLET | Freq: Every day | ORAL | 0 refills | Status: DC
  Filled 2023-06-13: qty 15, 30d supply, fill #0

## 2023-06-13 NOTE — Patient Instructions (Addendum)
 VISIT SUMMARY:  You came in today because you lost your medication and have been experiencing palpitations. We discussed your high-stress lifestyle, poor sleep, and dietary habits, which are contributing to your symptoms. We also talked about your hypertension and the importance of taking your medication regularly.  YOUR PLAN:  -HYPERTENSION: Hypertension means high blood pressure, which can lead to serious health problems if not managed. We prescribed amlodipine  to help control your blood pressure. It's important to reduce your salt intake, manage stress, and regularly check your blood pressure at home.  -HEART PALPITATIONS: Heart palpitations are feelings of having a fast-beating, fluttering, or pounding heart. We will continue your metoprolol  at a lower dose and encourage you to drink more water. Stress can also cause palpitations, so managing stress is important.  -STRESS AND ANXIETY: Chronic stress and anxiety can affect your overall health, including your heart and blood pressure. We prescribed hydroxyzine  to help manage your anxiety. It's important to improve your sleep habits and find ways to reduce stress in your life.  Insomnia Insomnia is a sleep disorder that makes it difficult to fall asleep or stay asleep. Insomnia can cause fatigue, low energy, difficulty concentrating, mood swings, and poor performance at work or school. There are three different ways to classify insomnia: Difficulty falling asleep. Difficulty staying asleep. Waking up too early in the morning. Any type of insomnia can be long-term (chronic) or short-term (acute). Both are common. Short-term insomnia usually lasts for 3 months or less. Chronic insomnia occurs at least three times a week for longer than 3 months. What are the causes? Insomnia may be caused by another condition, situation, or substance, such as: Having certain mental health conditions, such as anxiety and depression. Using caffeine, alcohol,  tobacco, or drugs. Having gastrointestinal conditions, such as gastroesophageal reflux disease (GERD). Having certain medical conditions. These include: Asthma. Alzheimer's disease. Stroke. Chronic pain. An overactive thyroid  gland (hyperthyroidism). Other sleep disorders, such as restless legs syndrome and sleep apnea. Menopause. Sometimes, the cause of insomnia may not be known. What increases the risk? Risk factors for insomnia include: Gender. Females are affected more often than males. Age. Insomnia is more common as people get older. Stress and certain medical and mental health conditions. Lack of exercise. Having an irregular work schedule. This may include working night shifts and traveling between different time zones. What are the signs or symptoms? If you have insomnia, the main symptom is having trouble falling asleep or having trouble staying asleep. This may lead to other symptoms, such as: Feeling tired or having low energy. Feeling nervous about going to sleep. Not feeling rested in the morning. Having trouble concentrating. Feeling irritable, anxious, or depressed. How is this diagnosed? This condition may be diagnosed based on: Your symptoms and medical history. Your health care provider may ask about: Your sleep habits. Any medical conditions you have. Your mental health. A physical exam. How is this treated? Treatment for insomnia depends on the cause. Treatment may focus on treating an underlying condition that is causing the insomnia. Treatment may also include: Medicines to help you sleep. Counseling or therapy. Lifestyle adjustments to help you sleep better. Follow these instructions at home: Eating and drinking  Limit or avoid alcohol, caffeinated beverages, and products that contain nicotine  and tobacco, especially close to bedtime. These can disrupt your sleep. Do not eat a large meal or eat spicy foods right before bedtime. This can lead to  digestive discomfort that can make it hard for you to sleep.  Sleep habits  Keep a sleep diary to help you and your health care provider figure out what could be causing your insomnia. Write down: When you sleep. When you wake up during the night. How well you sleep and how rested you feel the next day. Any side effects of medicines you are taking. What you eat and drink. Make your bedroom a dark, comfortable place where it is easy to fall asleep. Put up shades or blackout curtains to block light from outside. Use a white noise machine to block noise. Keep the temperature cool. Limit screen use before bedtime. This includes: Not watching TV. Not using your smartphone, tablet, or computer. Stick to a routine that includes going to bed and waking up at the same times every day and night. This can help you fall asleep faster. Consider making a quiet activity, such as reading, part of your nighttime routine. Try to avoid taking naps during the day so that you sleep better at night. Get out of bed if you are still awake after 15 minutes of trying to sleep. Keep the lights down, but try reading or doing a quiet activity. When you feel sleepy, go back to bed. General instructions Take over-the-counter and prescription medicines only as told by your health care provider. Exercise regularly as told by your health care provider. However, avoid exercising in the hours right before bedtime. Use relaxation techniques to manage stress. Ask your health care provider to suggest some techniques that may work well for you. These may include: Breathing exercises. Routines to release muscle tension. Visualizing peaceful scenes. Make sure that you drive carefully. Do not drive if you feel very sleepy. Keep all follow-up visits. This is important. Contact a health care provider if: You are tired throughout the day. You have trouble in your daily routine due to sleepiness. You continue to have sleep problems,  or your sleep problems get worse. Get help right away if: You have thoughts about hurting yourself or someone else. Get help right away if you feel like you may hurt yourself or others, or have thoughts about taking your own life. Go to your nearest emergency room or: Call 911. Call the National Suicide Prevention Lifeline at 251 667 2251 or 988. This is open 24 hours a day. Text the Crisis Text Line at 3524948902. Summary Insomnia is a sleep disorder that makes it difficult to fall asleep or stay asleep. Insomnia can be long-term (chronic) or short-term (acute). Treatment for insomnia depends on the cause. Treatment may focus on treating an underlying condition that is causing the insomnia. Keep a sleep diary to help you and your health care provider figure out what could be causing your insomnia. This information is not intended to replace advice given to you by your health care provider. Make sure you discuss any questions you have with your health care provider. Document Revised: 01/12/2021 Document Reviewed: 01/12/2021 Elsevier Patient Education  2024 [employer].

## 2023-06-13 NOTE — Telephone Encounter (Signed)
 Left voicemail to return call to office

## 2023-06-13 NOTE — Telephone Encounter (Signed)
 Patient came in stating he is out of medications for heart palpitations Everything prev prescribed was done by his PCP but says [name] was supposed to take it over  [place] over the medications in his chart, but states he couldn't remember the name  Please contact patient for more clarity

## 2023-06-13 NOTE — Progress Notes (Signed)
 Established Patient Office Visit  Subjective   Patient ID: Danny Bishop, male    DOB: [birth year]  Age: 33 y.o. MRN: [id number]  Chief Complaint  Patient presents with   Medication Refill    Patient has been out of medication x1 week   Patient is non compliant with taking bp medication    Palpitations    Patient states he feels he has a few palpitations earlier this morning.  Discussed the use of AI scribe software for clinical note transcription with the patient, who gave verbal consent to proceed.  History of Present Illness   The patient, a 33 year old male with a history of hypertension and palpitations, presents today after losing his medication. He reports experiencing palpitations earlier in the day, which he describes as a racing heart that makes him feel like he might pass out. These episodes can occur at any time and have decreased in frequency from constant to two or three times a day when he is not on meds and once every two days when he is. Despite taking metoprolol , the patient continues to experience these palpitations.   The patient leads a high-stress lifestyle due to working two jobs and managing child support obligations. He reports getting approximately four hours of sleep per day due to his work schedule and responsibilities at home. He admits to a high salt intake, primarily from fast food and snacks, and inadequate hydration, preferring juice over water.  The patient has not been taking his blood pressure medication regularly, stating that he doesn't feel it's making a difference. He reports being able to sense when his blood pressure is high, usually experiencing symptoms such as watery eyes and headaches. However, he acknowledges that his current feeling of high blood pressure is normal for him.     Past Medical History:  Diagnosis Date   Anxiety    Hypertension    Palpitations    Sickle cell trait (HCC)    Social History   Socioeconomic History    Marital status: Single    Spouse name: Not on file   Number of children: 5   Years of education: Not on file   Highest education level: Not on file  Occupational History   Occupation: Location manager  Tobacco Use   Smoking status: Every Day    Current packs/day: 0.35    Average packs/day: 0.4 packs/day for 10.0 years (3.5 ttl pk-yrs)    Types: Cigarettes   Smokeless tobacco: Never  Vaping Use   Vaping status: Never Used  Substance and Sexual Activity   Alcohol use: Yes    Alcohol/week: 7.0 standard drinks of alcohol    Types: 7 Cans of beer per week   Drug use: Not Currently    Types: Marijuana   Sexual activity: Yes    Birth control/protection: None  Other Topics Concern   Not on file  Social History Narrative   Not on file   Social Drivers of Health   Financial Resource Strain: Not on file  Food Insecurity: Not on file  Transportation Needs: Not on file  Physical Activity: Not on file  Stress: Not on file  Social Connections: Unknown (06/30/2021)   Received from [facility], [facility]   Social Network    Social Network: Not on file  Intimate Partner Violence: Unknown (05/22/2021)   [facility] from [facility], [facility]   HITS    Physically Hurt: Not on file    Insult or Talk Down To: Not on  file    Threaten Physical Harm: Not on file    Scream or Curse: Not on file   Family History  Problem Relation Age of Onset   Sickle cell trait Father    CAD Brother    Sickle cell trait Brother    Allergies  Allergen Reactions   Egg-Derived Products Nausea And Vomiting    Review of Systems  Constitutional: Negative.   HENT: Negative.    Eyes: Negative.   Respiratory:  Negative for shortness of breath.   Cardiovascular:  Negative for chest pain.  Gastrointestinal: Negative.   Genitourinary: Negative.   Musculoskeletal: Negative.   Skin: Negative.   Neurological: Negative.   Endo/Heme/Allergies: Negative.   Psychiatric/Behavioral:  The patient is  nervous/anxious and has insomnia.       Objective:     BP (!) 177/111 (BP Location: Left Arm, Patient Position: Sitting, Cuff Size: Large)   Pulse 85   Ht 6' (1.829 m)   Wt 189 lb (85.7 kg)   SpO2 99%   BMI 25.63 kg/m  BP Readings from Last 3 Encounters:  06/13/23 (!) 177/111  04/17/23 (!) 144/108  12/08/22 (!) 147/96   Wt Readings from Last 3 Encounters:  06/13/23 189 lb (85.7 kg)  04/17/23 173 lb (78.5 kg)  12/08/22 195 lb 1.7 oz (88.5 kg)    Physical Exam Vitals and nursing note reviewed.      GENERAL: Alert, cooperative, well developed, no acute distress. HEENT: Normocephalic, normal oropharynx, moist mucous membranes. CHEST: Clear to auscultation bilaterally, no wheezes, rhonchi, or crackles. CARDIOVASCULAR: Normal heart rate and rhythm, S1 and S2 normal without murmurs. Heart sounds strong and regular. ABDOMEN: Soft, non-tender, non-distended, without organomegaly, normal bowel sounds. EXTREMITIES: No cyanosis or edema. NEUROLOGICAL: Cranial nerves grossly intact, moves all extremities without gross motor or sensory deficit.    Assessment & Plan:   Problem List Items Addressed This Visit       Cardiovascular and Mediastinum   Essential hypertension - Primary   Relevant Medications   amLODipine  (NORVASC ) 5 MG tablet   metoprolol  succinate (TOPROL -XL) 50 MG 24 hr tablet   Other Relevant Orders   CBC with Differential/Platelet   Comp. Metabolic Panel (12)   TSH     Other   GAD (generalized anxiety disorder)   Relevant Medications   hydrOXYzine  (ATARAX ) 10 MG tablet   Other Relevant Orders   Vitamin D, 25-hydroxy   Other Visit Diagnoses       Palpitations       Relevant Medications   metoprolol  succinate (TOPROL -XL) 50 MG 24 hr tablet   Other Relevant Orders   TSH     Stress       Relevant Medications   hydrOXYzine  (ATARAX ) 10 MG tablet     Screening, lipid       Relevant Orders   Lipid panel     1. Essential hypertension  (Primary) Chronic hypertension with high blood pressure, risk of end-organ damage. Non-compliance with medication due to perceived inefficacy. Lifestyle factors contribute to hypertension. - Resume amlodipine . - Advise reducing salt intake and managing stress. - Recommend home blood pressure monitor for regular checks. - CBC with Differential/Platelet - Comp. Metabolic Panel (12) - TSH - amLODipine  (NORVASC ) 5 MG tablet; Take 1 tablet (5 mg total) by mouth daily.  Dispense: 30 tablet; Refill: 1  2. Palpitations Intermittent palpitations with tachycardia, no structural heart disease. Symptoms persist despite metoprolol , possibly due to dehydration and stress. - Continue metoprolol  at half dose  as tolerated. - Encourage increased water intake. - Discuss stress impact on palpitations.  - TSH - metoprolol  succinate (TOPROL -XL) 50 MG 24 hr tablet; Take 0.5 tablets (25 mg total) by mouth daily. Take with or immediately following a meal.  Dispense: 15 tablet; Refill: 1  3. GAD (generalized anxiety disorder) Chronic stress and anxiety from personal and financial pressures, affecting sleep and contributing to palpitations and hypertension. - Prescribe hydroxyzine  every eight hours as needed. - Encourage sleep hygiene and increased sleep duration. - Discuss stress management techniques and lifestyle changes. - hydrOXYzine  (ATARAX ) 10 MG tablet; Take 1 tablet (10 mg total) by mouth 3 (three) times daily as needed.  Dispense: 60 tablet; Refill: 1 - Vitamin D, 25-hydroxy  4. Stress  - hydrOXYzine  (ATARAX ) 10 MG tablet; Take 1 tablet (10 mg total) by mouth 3 (three) times daily as needed.  Dispense: 60 tablet; Refill: 1  5. Screening, lipid  - Lipid panel   I have reviewed the patient's medical history (PMH, PSH, Social History, Family History, Medications, and allergies) , and have been updated if relevant. I spent 30 minutes reviewing chart and  face to face time with patient.     Return  if symptoms worsen or fail to improve.    Etter Hermann Mayers, PA-C

## 2023-06-14 ENCOUNTER — Encounter: Payer: Self-pay | Admitting: Physician Assistant

## 2023-06-14 LAB — CBC WITH DIFFERENTIAL/PLATELET
Basophils Absolute: 0 10*3/uL (ref 0.0–0.2)
Basos: 0 %
EOS (ABSOLUTE): 0 10*3/uL (ref 0.0–0.4)
Eos: 0 %
Hematocrit: 45.7 % (ref 37.5–51.0)
Hemoglobin: 14.8 g/dL (ref 13.0–17.7)
Immature Grans (Abs): 0 10*3/uL (ref 0.0–0.1)
Immature Granulocytes: 0 %
Lymphocytes Absolute: 1.9 10*3/uL (ref 0.7–3.1)
Lymphs: 31 %
MCH: 30.4 pg (ref 26.6–33.0)
MCHC: 32.4 g/dL (ref 31.5–35.7)
MCV: 94 fL (ref 79–97)
Monocytes Absolute: 0.6 10*3/uL (ref 0.1–0.9)
Monocytes: 10 %
Neutrophils Absolute: 3.6 10*3/uL (ref 1.4–7.0)
Neutrophils: 59 %
Platelets: 328 10*3/uL (ref 150–450)
RBC: 4.87 x10E6/uL (ref 4.14–5.80)
RDW: 13.2 % (ref 11.6–15.4)
WBC: 6.1 10*3/uL (ref 3.4–10.8)

## 2023-06-14 LAB — LIPID PANEL
Chol/HDL Ratio: 3.5 ratio (ref 0.0–5.0)
Cholesterol, Total: 159 mg/dL (ref 100–199)
HDL: 46 mg/dL (ref >39)
LDL Chol Calc (NIH): 104 mg/dL — ABNORMAL HIGH (ref 0–99)
Triglycerides: 42 mg/dL (ref 0–149)
VLDL Cholesterol Cal: 9 mg/dL (ref 5–40)

## 2023-06-14 LAB — COMP. METABOLIC PANEL (12)
AST: 36 IU/L (ref 0–40)
Albumin: 4.7 g/dL (ref 4.1–5.1)
Alkaline Phosphatase: 159 IU/L — ABNORMAL HIGH (ref 44–121)
BUN/Creatinine Ratio: 13 (ref 9–20)
BUN: 10 mg/dL (ref 6–20)
Bilirubin Total: <0.2 mg/dL (ref 0.0–1.2)
Calcium: 9.4 mg/dL (ref 8.7–10.2)
Chloride: 103 mmol/L (ref 96–106)
Creatinine, Ser: 0.76 mg/dL (ref 0.76–1.27)
Globulin, Total: 2.6 g/dL (ref 1.5–4.5)
Glucose: 80 mg/dL (ref 70–99)
Potassium: 4 mmol/L (ref 3.5–5.2)
Sodium: 141 mmol/L (ref 134–144)
Total Protein: 7.3 g/dL (ref 6.0–8.5)
eGFR: 121 mL/min/{1.73_m2} (ref >59)

## 2023-06-14 LAB — VITAMIN D 25 HYDROXY (VIT D DEFICIENCY, FRACTURES): Vit D, 25-Hydroxy: 19.3 ng/mL — ABNORMAL LOW (ref 30.0–100.0)

## 2023-06-14 LAB — TSH: TSH: 0.861 u[IU]/mL (ref 0.450–4.500)

## 2023-06-14 NOTE — Addendum Note (Signed)
 Addended by: Malcom Scriver on: 06/14/2023 09:32 AM   Modules accepted: Orders

## 2023-06-15 NOTE — Telephone Encounter (Signed)
 Medications were refilled by PCP and pt has follow up scheduled with her on 06/17/23.  He is overdue to be seen here in follow (was due in 12/2022)  Will close this encounter since medications were filled by PCP.

## 2023-06-16 ENCOUNTER — Ambulatory Visit: Admitting: Physician Assistant

## 2023-06-17 ENCOUNTER — Encounter: Payer: Self-pay | Admitting: Nurse Practitioner

## 2023-06-17 ENCOUNTER — Other Ambulatory Visit: Payer: Self-pay

## 2023-06-17 ENCOUNTER — Ambulatory Visit: Attending: Nurse Practitioner | Admitting: Nurse Practitioner

## 2023-06-17 VITALS — BP 128/83 | HR 98 | Ht 72.0 in | Wt 187.0 lb

## 2023-06-17 DIAGNOSIS — R748 Abnormal levels of other serum enzymes: Secondary | ICD-10-CM | POA: Diagnosis not present

## 2023-06-17 DIAGNOSIS — E559 Vitamin D deficiency, unspecified: Secondary | ICD-10-CM

## 2023-06-17 DIAGNOSIS — I10 Essential (primary) hypertension: Secondary | ICD-10-CM | POA: Diagnosis not present

## 2023-06-17 DIAGNOSIS — R002 Palpitations: Secondary | ICD-10-CM | POA: Diagnosis not present

## 2023-06-17 MED ORDER — METOPROLOL SUCCINATE ER 50 MG PO TB24
25.0000 mg | ORAL_TABLET | Freq: Every day | ORAL | 1 refills | Status: AC
  Filled 2023-06-17: qty 45, 90d supply, fill #0

## 2023-06-17 MED ORDER — AMLODIPINE BESYLATE 5 MG PO TABS
5.0000 mg | ORAL_TABLET | Freq: Every day | ORAL | 1 refills | Status: AC
  Filled 2023-06-17: qty 90, 90d supply, fill #0

## 2023-06-17 MED ORDER — VITAMIN D (ERGOCALCIFEROL) 1.25 MG (50000 UNIT) PO CAPS
50000.0000 [IU] | ORAL_CAPSULE | ORAL | 0 refills | Status: AC
Start: 2023-06-17 — End: ?
  Filled 2023-06-17: qty 12, 84d supply, fill #0

## 2023-06-17 NOTE — Progress Notes (Signed)
 Assessment & Plan:  Danny Bishop was seen today for hypertension.  Diagnoses and all orders for this visit:  Essential hypertension -     amLODipine  (NORVASC ) 5 MG tablet; Take 1 tablet (5 mg total) by mouth daily. Continue all antihypertensives as prescribed.  Reminded to bring in blood pressure log for follow  up appointment.  RECOMMENDATIONS: DASH/Mediterranean Diets are healthier choices for HTN.    Vitamin D  deficiency disease -     Vitamin D , Ergocalciferol , (DRISDOL ) 1.25 MG (50000 UNIT) CAPS capsule; Take 1 capsule (50,000 Units total) by mouth once a week.  Elevated liver enzymes -     Hepatic Function Panel; Future -     Acute Viral Hepatitis (HAV, HBV, HCV); Future  Palpitations -     metoprolol  succinate (TOPROL -XL) 50 MG 24 hr tablet; Take 0.5 tablets (25 mg total) by mouth daily. Take with or immediately following a meal.    Patient has been counseled on age-appropriate routine health concerns for screening and prevention. These are reviewed and up-to-date. Referrals have been placed accordingly. Immunizations are up-to-date or declined.    Subjective:   Chief Complaint  Patient presents with   Hypertension    Danny Bishop 33 y.o. male presents to office today for follow-up to hypertension.  He is a patient of Dr. Lincoln Bishop so that he has not seen since September 30, 2022.   He has a past medical history of poorly controlled diabetes, tobacco dependence, GAD and medication nonadherence, sickle cell trait.   Blood pressure is well-controlled today and he endorses adherence taking metoprolol  12.5 mg daily for PVCs and amlodipine  5 mg daily for hypertension. BP Readings from Last 3 Encounters:  06/17/23 128/83  06/13/23 (!) 177/111  04/17/23 (!) 144/108     Review of Systems  Constitutional:  Negative for fever, malaise/fatigue and weight loss.  HENT: Negative.  Negative for nosebleeds.   Eyes: Negative.  Negative for blurred vision, double vision and photophobia.   Respiratory: Negative.  Negative for cough and shortness of breath.   Cardiovascular: Negative.  Negative for chest pain, palpitations and leg swelling.  Gastrointestinal: Negative.  Negative for heartburn, nausea and vomiting.  Musculoskeletal: Negative.  Negative for myalgias.  Neurological: Negative.  Negative for dizziness, focal weakness, seizures and headaches.  Psychiatric/Behavioral: Negative.  Negative for suicidal ideas.     Past Medical History:  Diagnosis Date   Anxiety    Hypertension    Palpitations    Sickle cell trait (HCC)     Past Surgical History:  Procedure Laterality Date   HAND SURGERY Right 2016   due to fracture involving index MCP    Family History  Problem Relation Age of Onset   Sickle cell trait Father    CAD Brother    Sickle cell trait Brother     Social History Reviewed with no changes to be made today.   Outpatient Medications Prior to Visit  Medication Sig Dispense Refill   hydrOXYzine  (ATARAX ) 10 MG tablet Take 1 tablet (10 mg total) by mouth 3 (three) times daily as needed. 60 tablet 1   amLODipine  (NORVASC ) 5 MG tablet Take 1 tablet (5 mg total) by mouth daily. 30 tablet 1   metoprolol  succinate (TOPROL -XL) 50 MG 24 hr tablet Take 0.5 tablets (25 mg total) by mouth daily. Take with or immediately following a meal. 15 tablet 1   doxycycline  (VIBRA -TABS) 100 MG tablet Take 1 tablet (100 mg total) by mouth 2 (two) times daily. (Patient  not taking: Reported on 06/17/2023) 14 tablet 0   nicotine  (NICODERM CQ  - DOSED IN MG/24 HOURS) 21 mg/24hr patch Place 1 patch (21 mg total) onto the skin daily. (Patient not taking: Reported on 06/17/2023) 28 patch 1   No facility-administered medications prior to visit.    Allergies  Allergen Reactions   Egg-Derived Products Nausea And Vomiting       Objective:    BP 128/83 (BP Location: Left Arm, Patient Position: Sitting, Cuff Size: Normal)   Pulse 98   Ht 6' (1.829 m)   Wt 187 lb (84.8 kg)   SpO2  98%   BMI 25.36 kg/m  Wt Readings from Last 3 Encounters:  06/17/23 187 lb (84.8 kg)  06/13/23 189 lb (85.7 kg)  04/17/23 173 lb (78.5 kg)    Physical Exam Vitals and nursing note reviewed.  Constitutional:      Appearance: He is well-developed.  HENT:     Head: Normocephalic and atraumatic.  Cardiovascular:     Rate and Rhythm: Normal rate and regular rhythm.     Heart sounds: Normal heart sounds. No murmur heard.    No friction rub. No gallop.  Pulmonary:     Effort: Pulmonary effort is normal. No tachypnea or respiratory distress.     Breath sounds: Normal breath sounds. No decreased breath sounds, wheezing, rhonchi or rales.  Chest:     Chest wall: No tenderness.  Abdominal:     General: Bowel sounds are normal.     Palpations: Abdomen is soft.  Musculoskeletal:        General: Normal range of motion.     Cervical back: Normal range of motion.  Skin:    General: Skin is warm and dry.  Neurological:     Mental Status: He is alert and oriented to person, place, and time.     Coordination: Coordination normal.  Psychiatric:        Behavior: Behavior normal. Behavior is cooperative.        Thought Content: Thought content normal.        Judgment: Judgment normal.          Patient has been counseled extensively about nutrition and exercise as well as the importance of adherence with medications and regular follow-up. The patient was given clear instructions to go to ER or return to medical center if symptoms don't improve, worsen or new problems develop. The patient verbalized understanding.   Follow-up: Return in about 3 months (around 09/17/2023) for DR Danny Bishop.   Danny Dean, FNP-BC [facility] [place], [place] [phone]   06/17/2023, 4:56 PM

## 2023-06-28 ENCOUNTER — Other Ambulatory Visit: Payer: Self-pay

## 2023-07-04 ENCOUNTER — Telehealth: Payer: Self-pay | Admitting: Internal Medicine

## 2023-07-04 NOTE — Telephone Encounter (Signed)
 Copied from CRM [id number]. Topic: General - Other >> Jul 04, 2023 10:53 AM Stanly Early wrote:  Reason for CRM: patient would like a referral for urology when he come in for his office visit on aug 11th 3:50pm

## 2023-07-04 NOTE — Telephone Encounter (Signed)
 Called & spoke to the patient. Verified name & DOB. Scheduled a sooner appointment to address urology concerns 08/09/2023. Patient agreed and expressed verbal understanding.

## 2023-08-09 ENCOUNTER — Ambulatory Visit: Admitting: Internal Medicine

## 2023-09-19 ENCOUNTER — Other Ambulatory Visit: Payer: Self-pay

## 2023-09-19 ENCOUNTER — Emergency Department (HOSPITAL_COMMUNITY)
Admission: EM | Admit: 2023-09-19 | Discharge: 2023-09-19 | Disposition: A | Discharge status: Home / Self Care | Attending: Emergency Medicine | Admitting: Emergency Medicine

## 2023-09-19 ENCOUNTER — Encounter (HOSPITAL_COMMUNITY): Payer: Self-pay

## 2023-09-19 DIAGNOSIS — R350 Frequency of micturition: Secondary | ICD-10-CM | POA: Diagnosis present

## 2023-09-19 DIAGNOSIS — R300 Dysuria: Secondary | ICD-10-CM | POA: Insufficient documentation

## 2023-09-19 DIAGNOSIS — Z79899 Other long term (current) drug therapy: Secondary | ICD-10-CM | POA: Insufficient documentation

## 2023-09-19 DIAGNOSIS — I10 Essential (primary) hypertension: Secondary | ICD-10-CM | POA: Insufficient documentation

## 2023-09-19 LAB — URINALYSIS, ROUTINE W REFLEX MICROSCOPIC
Bilirubin Urine: NEGATIVE
Glucose, UA: NEGATIVE mg/dL
Hgb urine dipstick: NEGATIVE
Ketones, ur: NEGATIVE mg/dL
Leukocytes,Ua: NEGATIVE
Nitrite: NEGATIVE
Protein, ur: NEGATIVE mg/dL
Specific Gravity, Urine: 1.011 (ref 1.005–1.030)
pH: 6 (ref 5.0–8.0)

## 2023-09-19 LAB — BASIC METABOLIC PANEL WITH GFR
Anion gap: 9 (ref 5–15)
BUN: 10 mg/dL (ref 6–20)
CO2: 25 mmol/L (ref 22–32)
Calcium: 9.1 mg/dL (ref 8.9–10.3)
Chloride: 106 mmol/L (ref 98–111)
Creatinine, Ser: 0.79 mg/dL (ref 0.61–1.24)
GFR, Estimated: >60 mL/min (ref >60)
Glucose, Bld: 124 mg/dL — ABNORMAL HIGH (ref 70–99)
Potassium: 3.7 mmol/L (ref 3.5–5.1)
Sodium: 140 mmol/L (ref 135–145)

## 2023-09-19 LAB — CBC
HCT: 42.4 % (ref 39.0–52.0)
Hemoglobin: 14.8 g/dL (ref 13.0–17.0)
MCH: 31.6 pg (ref 26.0–34.0)
MCHC: 34.9 g/dL (ref 30.0–36.0)
MCV: 90.4 fL (ref 80.0–100.0)
Platelets: 276 K/uL (ref 150–400)
RBC: 4.69 MIL/uL (ref 4.22–5.81)
RDW: 13.1 % (ref 11.5–15.5)
WBC: 5.7 K/uL (ref 4.0–10.5)
nRBC: 0 % (ref 0.0–0.2)

## 2023-09-19 NOTE — ED Triage Notes (Signed)
 Pt gives verbal consent for mse

## 2023-09-19 NOTE — ED Provider Notes (Signed)
 [facility] AT [facility] Provider Note   CSN: [id number] Arrival date & time: 09/19/23  1235     Patient presents with: Urinary Frequency   Danny Bishop is a 33 y.o. male.    Urinary Frequency  Patient resents urinary frequency.  Has had that for a while now.  States for last couple weeks has had a but has had episodes of it for years.  Has been seen by urology previously.  States he was told was bladder.  However asked to stop seeing urology due to loss of insurance and states he has insurance back.  Reviewed note from a few months ago requesting urology follow-up from PCP after visit that will be going later this month.  Denies penile discharge but states he will occasionally leak.  Denies chance of STD.  Denies testicle pain now and states he will get it at times.    Past Medical History:  Diagnosis Date   Anxiety    Hypertension    Palpitations    Sickle cell trait (HCC)     Prior to Admission medications   Medication Sig Start Date End Date Taking? Authorizing Provider  amLODipine  (NORVASC ) 5 MG tablet Take 1 tablet (5 mg total) by mouth daily. 06/17/23   Fleming, Zelda W, NP  hydrOXYzine  (ATARAX ) 10 MG tablet Take 1 tablet (10 mg total) by mouth 3 (three) times daily as needed. 06/13/23   Mayers, Cari S, PA-C  metoprolol  succinate (TOPROL -XL) 50 MG 24 hr tablet Take 0.5 tablets (25 mg total) by mouth daily. Take with or immediately following a meal. 06/17/23   Fleming, Zelda W, NP  Vitamin D , Ergocalciferol , (DRISDOL ) 1.25 MG (50000 UNIT) CAPS capsule Take 1 capsule (50,000 Units total) by mouth once a week. 06/17/23   Theotis Haze ORN, NP    Allergies: Egg-derived products    Review of Systems  Genitourinary:  Positive for frequency.    Updated Vital Signs BP (!) 175/101 (BP Location: Right Arm)   Pulse 96   Temp 98.5 F (36.9 C)   Resp 16   SpO2 98%   Physical Exam Vitals reviewed.  Cardiovascular:     Rate and Rhythm: Normal  rate.  Pulmonary:     Breath sounds: No wheezing.  Abdominal:     Tenderness: There is no abdominal tenderness.  Genitourinary:    Comments: No testicular tenderness. Neurological:     Mental Status: He is alert.     (all labs ordered are listed, but only abnormal results are displayed) Labs Reviewed  BASIC METABOLIC PANEL WITH GFR - Abnormal; Notable for the following components:      Result Value   Glucose, Bld 124 (*)    All other components within normal limits  URINALYSIS, ROUTINE W REFLEX MICROSCOPIC  CBC    EKG: None  Radiology: No results found.   Procedures   Medications Ordered in the ED - No data to display                                  Medical Decision Making Amount and/or Complexity of Data Reviewed Labs: ordered.   Patient with somewhat chronic dysuria.  Reportedly having difficulty with it and some frequency.  Has had this however for years.  Previously seen urology.  Reviewed notes.  Doubt infection at this time.  Urine reassuring.  Will have patient follow-up with urology.  Has short-term  follow-up with PCP also.     Final diagnoses:  Dysuria    ED Discharge Orders     None          Patsey Lot, MD 09/19/23 1540

## 2023-09-19 NOTE — ED Triage Notes (Signed)
 Pt to er, pt states that he is here for some urinary frequency.  Pt states that he is here to get checked.  Pt states that he has had the frequency for the past couple weeks.

## 2023-09-26 ENCOUNTER — Ambulatory Visit: Admitting: Internal Medicine
